# Patient Record
Sex: Male | Born: 1985 | Race: White | Hispanic: No | Marital: Married | Smoking: Former smoker
Health system: Southern US, Community
[De-identification: ages and names within clinical notes are randomized; demographics above are authoritative.]

## PROBLEM LIST (undated history)

## (undated) DIAGNOSIS — R55 Syncope and collapse: Secondary | ICD-10-CM

## (undated) DIAGNOSIS — G43909 Migraine, unspecified, not intractable, without status migrainosus: Secondary | ICD-10-CM

## (undated) HISTORY — DX: Syncope and collapse: R55

## (undated) HISTORY — PX: NO PAST SURGERIES: SHX2092

---

## 2008-02-14 ENCOUNTER — Emergency Department (HOSPITAL_COMMUNITY): Admission: EM | Admit: 2008-02-14 | Discharge: 2008-02-14 | Payer: Self-pay | Admitting: Emergency Medicine

## 2009-07-14 IMAGING — CR DG CHEST 2V
2 series · 2 of 2 positions shown · non-contrast
Comparison: None

CLINICAL DATA: Chest pain

CHEST - 2 VIEW

[w chest pa]
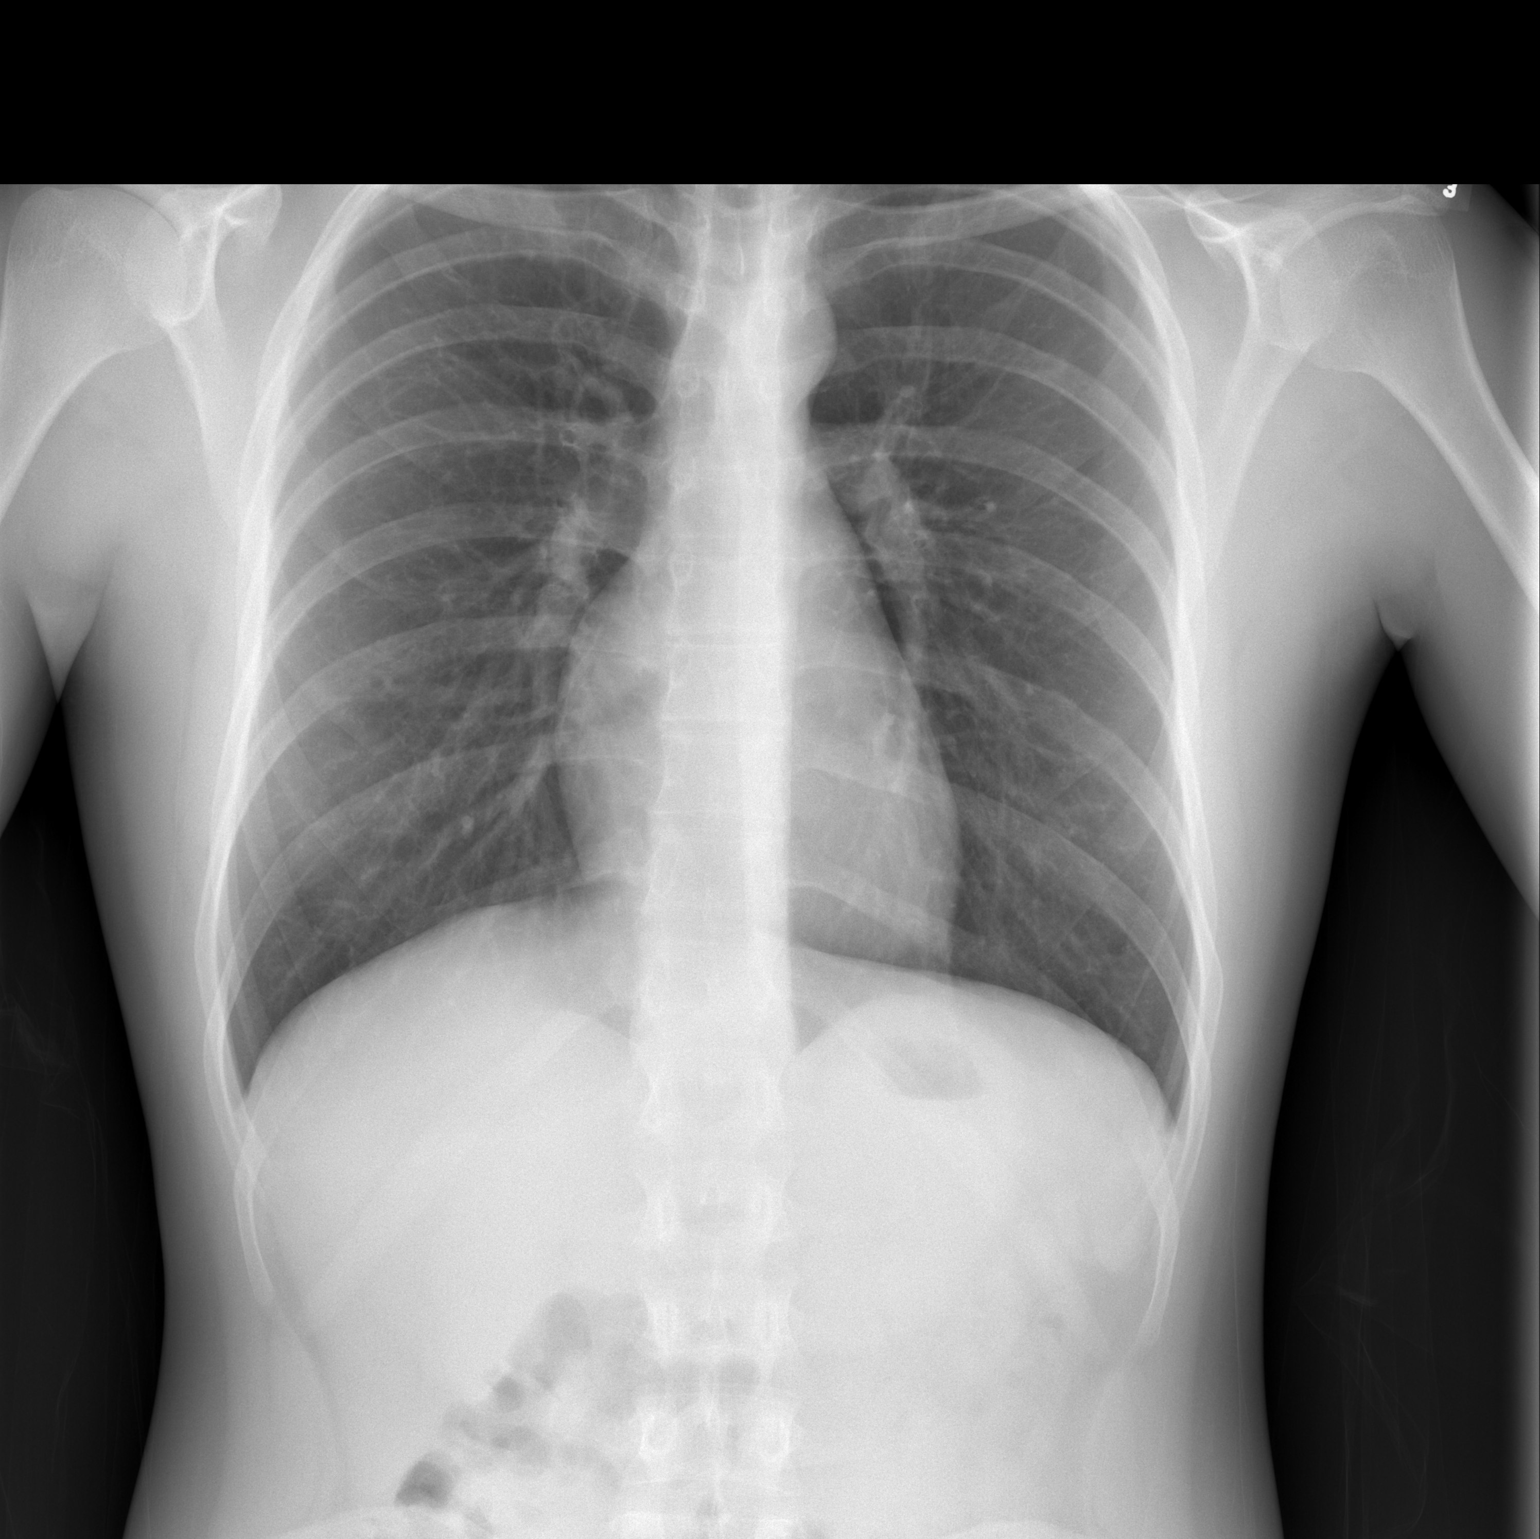

[w chest lat]
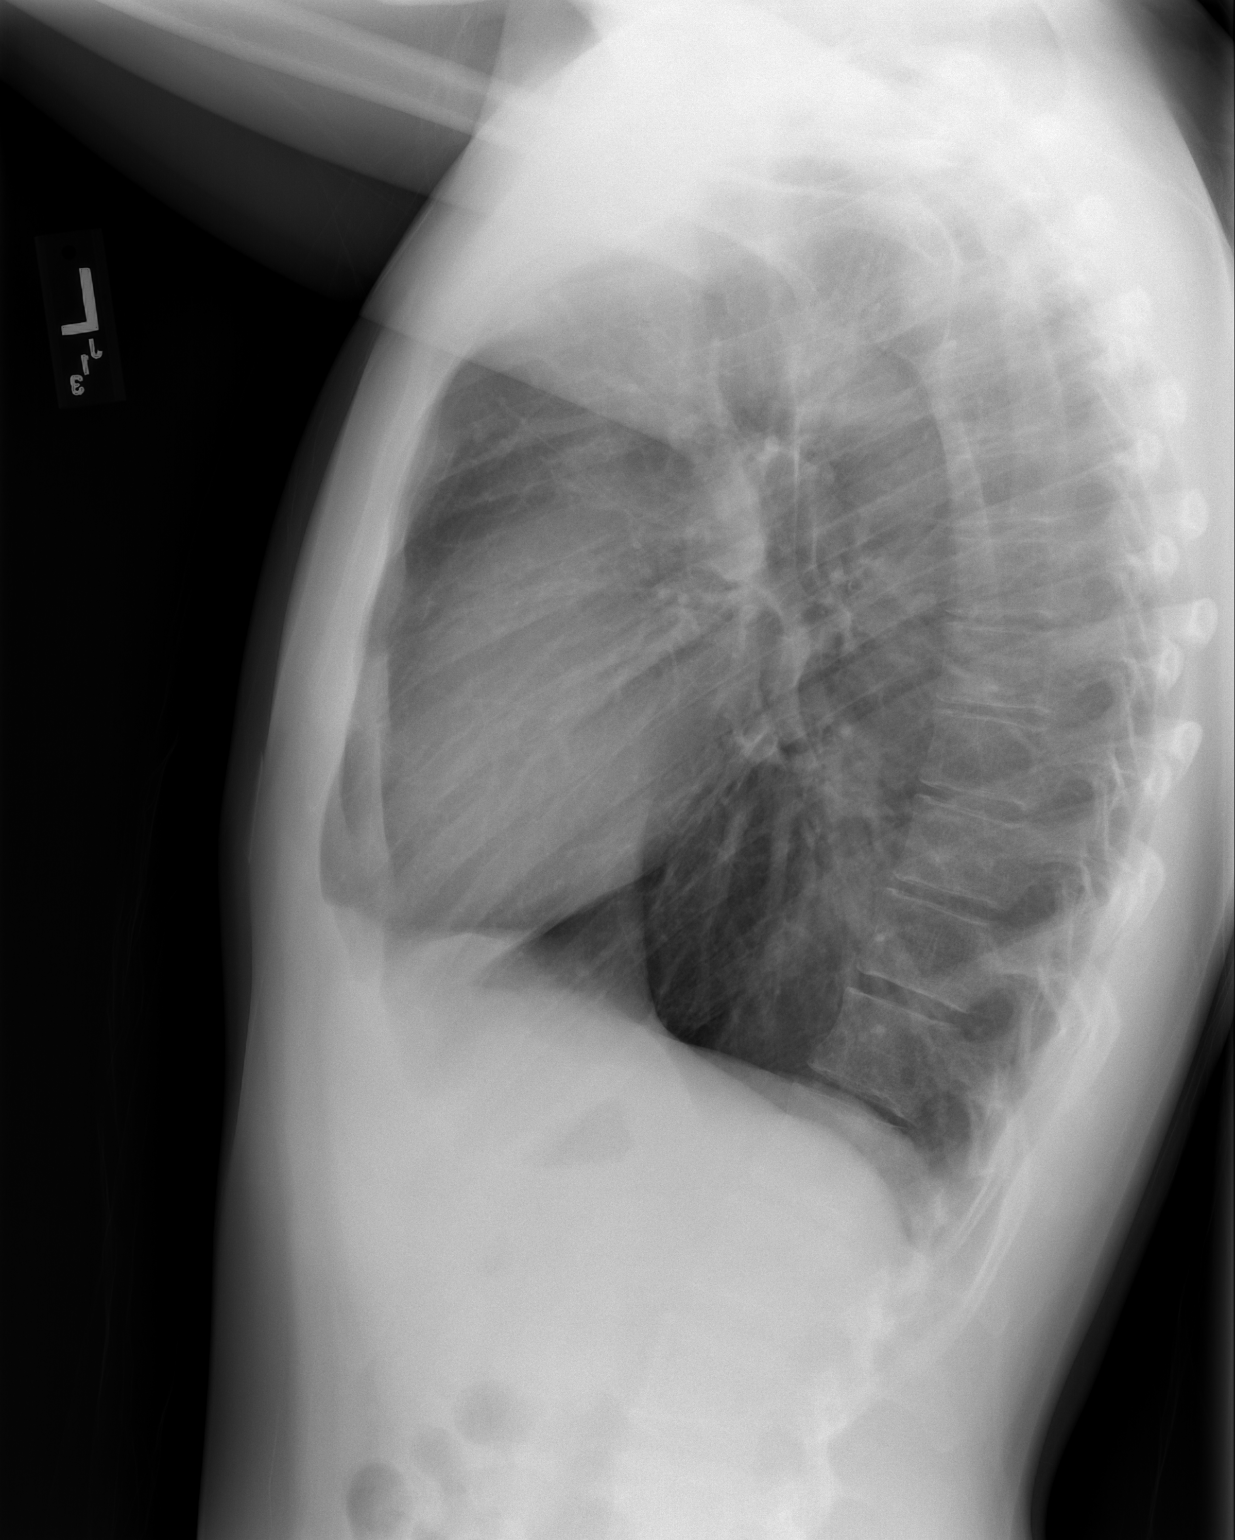

[2 of 2 positions shown; findings below may reference images not displayed]

FINDINGS: The heart size and mediastinal contours are within normal
limits.  Both lungs are clear.  The visualized skeletal structures
are unremarkable.
IMPRESSION: No active cardiopulmonary disease.

## 2011-04-25 LAB — POCT I-STAT, CHEM 8
BUN: 8
Calcium, Ion: 1.21
Chloride: 104
Creatinine, Ser: 1
Sodium: 142
TCO2: 28

## 2011-08-08 ENCOUNTER — Ambulatory Visit: Payer: Worker's Compensation

## 2011-08-12 ENCOUNTER — Ambulatory Visit: Payer: Worker's Compensation

## 2012-03-10 ENCOUNTER — Ambulatory Visit (INDEPENDENT_AMBULATORY_CARE_PROVIDER_SITE_OTHER): Payer: BC Managed Care – PPO | Admitting: Family Medicine

## 2012-03-10 VITALS — BP 118/64 | HR 70 | Temp 97.6°F | Resp 16 | Ht 65.5 in | Wt 138.4 lb

## 2012-03-10 DIAGNOSIS — R55 Syncope and collapse: Secondary | ICD-10-CM

## 2012-03-10 HISTORY — DX: Syncope and collapse: R55

## 2012-03-10 LAB — POCT CBC
Hemoglobin: 14.7 g/dL (ref 14.1–18.1)
Lymph, poc: 1.8 (ref 0.6–3.4)
MCH, POC: 27.8 pg (ref 27–31.2)
MCHC: 31.2 g/dL — AB (ref 31.8–35.4)
MID (cbc): 0.7 (ref 0–0.9)
MPV: 8.9 fL (ref 0–99.8)
POC Granulocyte: 9.4 — AB (ref 2–6.9)
POC MID %: 6.1 %M (ref 0–12)
Platelet Count, POC: 374 10*3/uL (ref 142–424)
RDW, POC: 13.2 %
WBC: 11.9 10*3/uL — AB (ref 4.6–10.2)

## 2012-03-10 NOTE — Patient Instructions (Signed)
Nice to meet you.  I think you will be ok I would decrease amount of caffeine slowly.  If you get any shortness of breath, more dizziness or chest pain please come back and see [place] again.  Neurocardiogenic Syncope, Child Neurocardiogenic syncope (NCS) is the most common cause of fainting in children. It is a response to a sudden and brief loss of consciousness due to decreased blood flow to the brain. It is uncommon before 4 to 24 years of age.  CAUSES  NCS is caused by a decrease in the blood pressure and heart rate due to a series of events in the nervous and cardiac systems. Many things and situations can trigger an episode. Some of these include:  Pain.   Fear.   The sight of blood.   Common activities like coughing, swallowing, stretching, and going to the bathroom.   Emotional stress.   Prolonged standing (especially in a warm environment).   Lack of sleep or rest.   Not eating for a longtime.   Not drinking enough liquids.   Recent illness.  SYMPTOMS  Before the fainting episode, your child may:  Feel dizzy or light-headed.   Sense that he or she is going to faint.   Feel like the room is spinning.   Feel sick to their stomach (nauseous).   See spots or slowly lose vision.   Hear ringing in the ears.   Have a headache.   Feel hot and sweaty.   Have no warnings at all.  DIAGNOSIS The diagnosis is made after a history is taken and by doing tests to rule out other causes for fainting. Testing may include the following:  Blood tests.   A test of the electrical function of the heart (electrocardiogram, ECG, EKG).   A test used check response to change in position (tilt table test).   A test to get a picture of the heart using sound waves (echocardiogram).  TREATMENT Treatment of NCS is usually limited to reassurance and home remedies. If home treatments do not work, your child's caregiver may prescribe medicines to help prevent fainting. Talk to your  caregiver if you have any questions about NCS or treatment. HOME CARE INSTRUCTIONS   Teach your child the warning signs of NCS.   Have your child sit or lie down at the first warning sign of a fainting spell. If sitting, have them put their head down between their legs.   Your child should avoid hot tubs, saunas, or prolonged standing.   Have your child drink enough fluids to keep their urine clear or pale yello and have them avoid caffeine. Let your child have a bottle of water in school.   Increase salt in your child's diet as instructed by your child's caregiver.   If your child has to stand for a long time, have them:   Cross their legs.   Flex and stretch their leg muscles.   Squat.   Move their legs.   Bend over.   Do notsuddenly stop any of their medicines prescribed for NCS.  Remember that even though these spells are scary to watch, they do not harm the child.  SEEK MEDICAL CARE IF:   Fainting spells continue in spite of the treatment or more frequently.   Loss of consciousness lasts more than a few seconds.   Fainting spells occur during or after excercising, or after being startled.   New symptoms occur with the fainting spells such as:   Shortness of  breath.   Chest pain.   Irregular heart beats.   Twitching or stiffening spells:   Happen without obvious fainting.   Last longer than a few seconds.   Take longer than a few seconds to recover from.  SEEK IMMEDIATE MEDICAL CARE IF:  Injuries or bleeding happens after a fainting spell.   Twitching and stiffening spells last more than 5 minutes.   One twitching and stiffening spell follows another without a return of consciousness.  Document Released: 04/22/2008 Document Revised: 07/03/2011 Document Reviewed: 04/22/2008 [facility] Patient Information 2012 [place], [place].

## 2012-03-10 NOTE — Progress Notes (Signed)
Patient ID: Justin Ellis, male   DOB: 1985-12-19, 26 y.o.   MRN: [id number]  S: 26 yo male with no significant PMHx who was driving to work today felt a little light headed and weak while he drove, was able to pull into work and able to get into the building but did fall when he went in.  Patient did not LOC, did not hit head.  Patient states his friends at work took him to the bathroom he did have stomach cramps during his drive as well and was able to defecate, diarrhea, no black tarry stool, no blood  but relieved the cramping.  Patient then started to feel better overall. EMS reponded check vitals and CBG which was norma.  Patient feels back to his baseline.   Patient states he has not had any significant life changes, he has decrease the amount of caffeine and did not eat any breakfast before he went to work. Patient also states he drinks about 2 L of sugary soda daily. Patient did not have any today.  Review of systems: Patient denies any headache any blurred vision any loss of vision, any history of head injury, any numbness or tingling in the extremities or any loss of bowel during the event.  Past medical surgical family history was all reviewed without any changes.  Physical exam: Filed Vitals:   03/10/12 1656  BP: 118/64  Pulse: 70  Temp: 97.6 F (36.4 C)  Resp: 16   HEENT: Pupils equal round reactive light and accommodation, extraocular movements intact, cranial nerve II through XII are intact, thyroid nonpalpable nontender. Cardiovascular: Regular rate and rhythm no murmur appreciated carotids no bruit Respiratory: Clear to auscultation bilaterally  abdomen: Bowel sounds positive nontender nondistended liver and spleen normal size negative McBurney's. Neuro: Deep tendon reflex 2+ and symmetric bilaterally, neurovascularly intact in all extremities. Bilateral 5 strength in all extremities. Extremities: No edema 2+ distal pulses palpated.  EKG was reviewed today patient has a  normal sinus rhythm with no signs of reentry pathways or any signs of PVCs.

## 2012-03-10 NOTE — Assessment & Plan Note (Addendum)
Pt is healthy 26 yo male, EKG normal today, will get labs rule out electrolyte or anemia as possible causes.  Differential include, dehydration, vasovagal, panic attack or very low liklihood and cardiac.  F/u labs, decrease caffeine intake, and f/u with any symptoms listed in AVS.  If occurs again would then send to cardiologist for further workup and CXR but hopefully not necessary.

## 2012-03-11 LAB — BASIC METABOLIC PANEL
Calcium: 10 mg/dL (ref 8.4–10.5)
Creat: 0.8 mg/dL (ref 0.50–1.35)
Sodium: 139 mEq/L (ref 135–145)

## 2012-03-15 ENCOUNTER — Encounter: Payer: Self-pay | Admitting: Radiology

## 2015-02-05 ENCOUNTER — Encounter (HOSPITAL_COMMUNITY): Payer: Self-pay

## 2015-02-05 ENCOUNTER — Emergency Department (HOSPITAL_COMMUNITY)
Admission: EM | Admit: 2015-02-05 | Discharge: 2015-02-06 | Disposition: A | Discharge status: Home / Self Care | Payer: BLUE CROSS/BLUE SHIELD | Attending: Emergency Medicine | Admitting: Emergency Medicine

## 2015-02-05 DIAGNOSIS — Z720 Tobacco use: Secondary | ICD-10-CM | POA: Insufficient documentation

## 2015-02-05 DIAGNOSIS — Z79899 Other long term (current) drug therapy: Secondary | ICD-10-CM | POA: Insufficient documentation

## 2015-02-05 DIAGNOSIS — G8929 Other chronic pain: Secondary | ICD-10-CM | POA: Diagnosis not present

## 2015-02-05 DIAGNOSIS — R42 Dizziness and giddiness: Secondary | ICD-10-CM

## 2015-02-05 NOTE — ED Notes (Signed)
Pt complains of having dizzy spells and near sycopal episodes for several weeks

## 2015-02-06 LAB — COMPREHENSIVE METABOLIC PANEL
ALBUMIN: 4.3 g/dL (ref 3.5–5.0)
ALT: 18 U/L (ref 17–63)
AST: 20 U/L (ref 15–41)
Alkaline Phosphatase: 70 U/L (ref 38–126)
Anion gap: 7 (ref 5–15)
BILIRUBIN TOTAL: 0.6 mg/dL (ref 0.3–1.2)
BUN: 6 mg/dL (ref 6–20)
CALCIUM: 9.3 mg/dL (ref 8.9–10.3)
CHLORIDE: 106 mmol/L (ref 101–111)
CO2: 29 mmol/L (ref 22–32)
CREATININE: 0.85 mg/dL (ref 0.61–1.24)
GLUCOSE: 114 mg/dL — AB (ref 65–99)
POTASSIUM: 3.7 mmol/L (ref 3.5–5.1)
SODIUM: 142 mmol/L (ref 135–145)
TOTAL PROTEIN: 7.4 g/dL (ref 6.5–8.1)

## 2015-02-06 LAB — CBC WITH DIFFERENTIAL/PLATELET
BASOS ABS: 0 10*3/uL (ref 0.0–0.1)
BASOS PCT: 1 % (ref 0–1)
EOS ABS: 0.1 10*3/uL (ref 0.0–0.7)
Eosinophils Relative: 2 % (ref 0–5)
HCT: 42.1 % (ref 39.0–52.0)
HEMOGLOBIN: 13.7 g/dL (ref 13.0–17.0)
LYMPHS ABS: 2.1 10*3/uL (ref 0.7–4.0)
Lymphocytes Relative: 36 % (ref 12–46)
MCH: 28.3 pg (ref 26.0–34.0)
MCHC: 32.5 g/dL (ref 30.0–36.0)
MCV: 87 fL (ref 78.0–100.0)
MONO ABS: 0.7 10*3/uL (ref 0.1–1.0)
Monocytes Relative: 12 % (ref 3–12)
NEUTROS PCT: 49 % (ref 43–77)
Neutro Abs: 2.9 10*3/uL (ref 1.7–7.7)
PLATELETS: 268 10*3/uL (ref 150–400)
RBC: 4.84 MIL/uL (ref 4.22–5.81)
RDW: 12.5 % (ref 11.5–15.5)
WBC: 5.8 10*3/uL (ref 4.0–10.5)

## 2015-02-06 NOTE — ED Provider Notes (Signed)
CSN: [id number]     Arrival date & time 02/05/15  2047 History   First MD Initiated Contact with Patient 02/06/15 0030     Chief Complaint  Patient presents with  . Dizziness     (Consider location/radiation/quality/duration/timing/severity/associated sxs/prior Treatment) HPI  This is a 29 year old male with a history of intermittent lightheadedness for several months. He is here this morning because his symptoms have acutely worsened. He had an episode where his vision became black and he felt like he was going to fall to the ground. His symptoms are worse on standing. He denies associated palpitations or chest pain. He is having no focal neurologic deficits. He has chronic joint pain, particularly in his knees, but this is not new. He does not wish any pain medication as he has 2 brothers with narcotic addiction problems.  Past Medical History  Diagnosis Date  . Vasovagal near syncope 03/10/2012   History reviewed. No pertinent past surgical history. History reviewed. No pertinent family history. History  Substance Use Topics  . Smoking status: Current Every Day Smoker -- 1.00 packs/day for 3 years    Types: Cigarettes  . Smokeless tobacco: Not on file  . Alcohol Use: Yes    Review of Systems  All other systems reviewed and are negative.   Allergies  Review of patient's allergies indicates no known allergies.  Home Medications   Prior to Admission medications   Medication Sig Start Date End Date Taking? Authorizing Provider  Aspirin-Salicylamide-Caffeine (BC HEADACHE POWDER PO) Take 1 packet by mouth every 6 (six) hours as needed (for headache/pain.).   Yes Historical Provider, MD  pseudoephedrine (SUDAFED 12 HOUR) 120 MG 12 hr tablet Take 120 mg by mouth daily.   Yes Historical Provider, MD   BP 119/80 mmHg  Pulse 80  Temp(Src) 98.3 F (36.8 C) (Oral)  Resp 18  Ht 5\' 6"  (1.676 m)  Wt 119 lb (53.978 kg)  BMI 19.22 kg/m2  SpO2 100%   Physical Exam  General:  Well-developed, well-nourished male in no acute distress; appearance consistent with age of record HENT: normocephalic; atraumatic Eyes: pupils equal, round and reactive to light; extraocular muscles intact Neck: supple Heart: regular rate and rhythm; no murmur Lungs: clear to auscultation bilaterally Abdomen: soft; nondistended; nontender; no masses or hepatosplenomegaly; bowel sounds present Extremities: No deformity; full range of motion; pulses normal Neurologic: Awake, alert and oriented; motor function intact in all extremities and symmetric; no facial droop Skin: Warm and dry Psychiatric: Normal mood and affect    ED Course  Procedures (including critical care time)   MDM  Nursing notes and vitals signs, including pulse oximetry, reviewed.  Summary of this visit's results, reviewed by myself:   EKG Interpretation  Date/Time:  Tuesday February 06 2015 01:03:15 EDT Ventricular Rate:  73 PR Interval:  126 QRS Duration: 88 QT Interval:  374 QTC Calculation: 412 R Axis:   86 Text Interpretation:  Sinus rhythm Normal ECG No previous ECGs available Confirmed by Lilianah Buffin  MD, Jonny RuizJOHN ([id number]) on 02/06/2015 1:07:30 AM      Labs:  Results for orders placed or performed during the hospital encounter of 02/05/15 (from the past 24 hour(s))  CBC with Differential/Platelet     Status: None   Collection Time: 02/06/15 12:50 AM  Result Value Ref Range   WBC 5.8 4.0 - 10.5 K/uL   RBC 4.84 4.22 - 5.81 MIL/uL   Hemoglobin 13.7 13.0 - 17.0 g/dL   HCT 19.142.1 47.839.0 - 29.552.0 %  MCV 87.0 78.0 - 100.0 fL   MCH 28.3 26.0 - 34.0 pg   MCHC 32.5 30.0 - 36.0 g/dL   RDW 40.9 81.1 - 91.4 %   Platelets 268 150 - 400 K/uL   Neutrophils Relative % 49 43 - 77 %   Neutro Abs 2.9 1.7 - 7.7 K/uL   Lymphocytes Relative 36 12 - 46 %   Lymphs Abs 2.1 0.7 - 4.0 K/uL   Monocytes Relative 12 3 - 12 %   Monocytes Absolute 0.7 0.1 - 1.0 K/uL   Eosinophils Relative 2 0 - 5 %   Eosinophils Absolute 0.1 0.0 - 0.7 K/uL    Basophils Relative 1 0 - 1 %   Basophils Absolute 0.0 0.0 - 0.1 K/uL  Comprehensive metabolic panel     Status: Abnormal   Collection Time: 02/06/15 12:50 AM  Result Value Ref Range   Sodium 142 135 - 145 mmol/L   Potassium 3.7 3.5 - 5.1 mmol/L   Chloride 106 101 - 111 mmol/L   CO2 29 22 - 32 mmol/L   Glucose, Bld 114 (H) 65 - 99 mg/dL   BUN 6 6 - 20 mg/dL   Creatinine, Ser 7.82 0.61 - 1.24 mg/dL   Calcium 9.3 8.9 - 95.6 mg/dL   Total Protein 7.4 6.5 - 8.1 g/dL   Albumin 4.3 3.5 - 5.0 g/dL   AST 20 15 - 41 U/L   ALT 18 17 - 63 U/L   Alkaline Phosphatase 70 38 - 126 U/L   Total Bilirubin 0.6 0.3 - 1.2 mg/dL   GFR calc non Af Amer >60 >60 mL/min   GFR calc Af Amer >60 >60 mL/min   Anion gap 7 5 - 15   2:09 AM Patient advised of unremarkable workup. He was advised that further investigation may be warranted, such as a cardiology referral or tilt table test. He has a primary care physician with whom he can follow-up.    Paula Libra, MD 02/06/15 [phone]

## 2015-02-09 ENCOUNTER — Other Ambulatory Visit: Payer: Self-pay | Admitting: Family Medicine

## 2015-02-09 ENCOUNTER — Encounter: Payer: Self-pay | Admitting: Family Medicine

## 2015-02-09 ENCOUNTER — Ambulatory Visit (INDEPENDENT_AMBULATORY_CARE_PROVIDER_SITE_OTHER): Payer: BLUE CROSS/BLUE SHIELD

## 2015-02-09 ENCOUNTER — Ambulatory Visit (INDEPENDENT_AMBULATORY_CARE_PROVIDER_SITE_OTHER): Payer: BLUE CROSS/BLUE SHIELD | Admitting: Family Medicine

## 2015-02-09 VITALS — BP 97/68 | HR 91 | Temp 98.4°F | Resp 16 | Ht 66.0 in | Wt 120.0 lb

## 2015-02-09 DIAGNOSIS — M25561 Pain in right knee: Secondary | ICD-10-CM

## 2015-02-09 DIAGNOSIS — R42 Dizziness and giddiness: Secondary | ICD-10-CM | POA: Diagnosis not present

## 2015-02-09 DIAGNOSIS — I951 Orthostatic hypotension: Secondary | ICD-10-CM

## 2015-02-09 DIAGNOSIS — J3489 Other specified disorders of nose and nasal sinuses: Secondary | ICD-10-CM

## 2015-02-09 DIAGNOSIS — G90A Postural orthostatic tachycardia syndrome (POTS): Secondary | ICD-10-CM

## 2015-02-09 DIAGNOSIS — R51 Headache: Secondary | ICD-10-CM

## 2015-02-09 DIAGNOSIS — R519 Headache, unspecified: Secondary | ICD-10-CM

## 2015-02-09 DIAGNOSIS — Q682 Congenital deformity of knee: Secondary | ICD-10-CM | POA: Diagnosis not present

## 2015-02-09 DIAGNOSIS — M25562 Pain in left knee: Secondary | ICD-10-CM

## 2015-02-09 DIAGNOSIS — R000 Tachycardia, unspecified: Secondary | ICD-10-CM | POA: Diagnosis not present

## 2015-02-09 LAB — POCT URINALYSIS DIPSTICK
BILIRUBIN UA: NEGATIVE
Blood, UA: NEGATIVE
Glucose, UA: NEGATIVE
KETONES UA: NEGATIVE
LEUKOCYTES UA: NEGATIVE
NITRITE UA: NEGATIVE
PH UA: 6
Protein, UA: NEGATIVE
UROBILINOGEN UA: 0.2

## 2015-02-09 LAB — VITAMIN B12: Vitamin B-12: 383 pg/mL (ref 211–911)

## 2015-02-09 LAB — TSH: TSH: 1.947 u[IU]/mL (ref 0.350–4.500)

## 2015-02-09 NOTE — Patient Instructions (Addendum)
Knee Pain The knee is the complex joint between your thigh and your lower leg. It is made up of bones, tendons, ligaments, and cartilage. The bones that make up the knee are:  The femur in the thigh.  The tibia and fibula in the lower leg.  The patella or kneecap riding in the groove on the lower femur. CAUSES  Knee pain is a common complaint with many causes. A few of these causes are:  Injury, such as:  A ruptured ligament or tendon injury.  Torn cartilage.  Medical conditions, such as:  Gout  Arthritis  Infections  Overuse, over training, or overdoing a physical activity. Knee pain can be minor or severe. Knee pain can accompany debilitating injury. Minor knee problems often respond well to self-care measures or get well on their own. More serious injuries may need medical intervention or even surgery. SYMPTOMS The knee is complex. Symptoms of knee problems can vary widely. Some of the problems are:  Pain with movement and weight bearing.  Swelling and tenderness.  Buckling of the knee.  Inability to straighten or extend your knee.  Your knee locks and you cannot straighten it.  Warmth and redness with pain and fever.  Deformity or dislocation of the kneecap. DIAGNOSIS  Determining what is wrong may be very straight forward such as when there is an injury. It can also be challenging because of the complexity of the knee. Tests to make a diagnosis may include:  Your caregiver taking a history and doing a physical exam.  Routine X-rays can be used to rule out other problems. X-rays will not reveal a cartilage tear. Some injuries of the knee can be diagnosed by:  Arthroscopy a surgical technique by which a small video camera is inserted through tiny incisions on the sides of the knee. This procedure is used to examine and repair internal knee joint problems. Tiny instruments can be used during arthroscopy to repair the torn knee cartilage  (meniscus).  Arthrography is a radiology technique. A contrast liquid is directly injected into the knee joint. Internal structures of the knee joint then become visible on X-ray film.  An MRI scan is a non X-ray radiology procedure in which magnetic fields and a computer produce two- or three-dimensional images of the inside of the knee. Cartilage tears are often visible using an MRI scanner. MRI scans have largely replaced arthrography in diagnosing cartilage tears of the knee.  Blood work.  Examination of the fluid that helps to lubricate the knee joint (synovial fluid). This is done by taking a sample out using a needle and a syringe. TREATMENT The treatment of knee problems depends on the cause. Some of these treatments are:  Depending on the injury, proper casting, splinting, surgery, or physical therapy care will be needed.  Give yourself adequate recovery time. Do not overuse your joints. If you begin to get sore during workout routines, back off. Slow down or do fewer repetitions.  For repetitive activities such as cycling or running, maintain your strength and nutrition.  Alternate muscle groups. For example, if you are a weight lifter, work the upper body on one day and the lower body the next.  Either tight or weak muscles do not give the proper support for your knee. Tight or weak muscles do not absorb the stress placed on the knee joint. Keep the muscles surrounding the knee strong.  Take care of mechanical problems.  If you have flat feet, orthotics or special shoes may help.  See your caregiver if you need help.  Arch supports, sometimes with wedges on the inner or outer aspect of the heel, can help. These can shift pressure away from the side of the knee most bothered by osteoarthritis.  A brace called an "unloader" brace also may be used to help ease the pressure on the most arthritic side of the knee.  If your caregiver has prescribed crutches, braces, wraps or ice,  use as directed. The acronym for this is PRICE. This means protection, rest, ice, compression, and elevation.  Nonsteroidal anti-inflammatory drugs (NSAIDs), can help relieve pain. But if taken immediately after an injury, they may actually increase swelling. Take NSAIDs with food in your stomach. Stop them if you develop stomach problems. Do not take these if you have a history of ulcers, stomach pain, or bleeding from the bowel. Do not take without your caregiver's approval if you have problems with fluid retention, heart failure, or kidney problems.  For ongoing knee problems, physical therapy may be helpful.  Glucosamine and chondroitin are over-the-counter dietary supplements. Both may help relieve the pain of osteoarthritis in the knee. These medicines are different from the usual anti-inflammatory drugs. Glucosamine may decrease the rate of cartilage destruction.  Injections of a corticosteroid drug into your knee joint may help reduce the symptoms of an arthritis flare-up. They may provide pain relief that lasts a few months. You may have to wait a few months between injections. The injections do have a small increased risk of infection, water retention, and elevated blood sugar levels.  Hyaluronic acid injected into damaged joints may ease pain and provide lubrication. These injections may work by reducing inflammation. A series of shots may give relief for as long as 6 months.  Topical painkillers. Applying certain ointments to your skin may help relieve the pain and stiffness of osteoarthritis. Ask your pharmacist for suggestions. Many over the-counter products are approved for temporary relief of arthritis pain.  In some countries, doctors often prescribe topical NSAIDs for relief of chronic conditions such as arthritis and tendinitis. A review of treatment with NSAID creams found that they worked as well as oral medications but without the serious side effects. PREVENTION  Maintain a  healthy weight. Extra pounds put more strain on your joints.  Get strong, stay limber. Weak muscles are a common cause of knee injuries. Stretching is important. Include flexibility exercises in your workouts.  Be smart about exercise. If you have osteoarthritis, chronic knee pain or recurring injuries, you may need to change the way you exercise. This does not mean you have to stop being active. If your knees ache after jogging or playing basketball, consider switching to swimming, water aerobics, or other low-impact activities, at least for a few days a week. Sometimes limiting high-impact activities will provide relief.  Make sure your shoes fit well. Choose footwear that is right for your sport.  Protect your knees. Use the proper gear for knee-sensitive activities. Use kneepads when playing volleyball or laying carpet. Buckle your seat belt every time you drive. Most shattered kneecaps occur in car accidents.  Rest when you are tired. SEEK MEDICAL CARE IF:  You have knee pain that is continual and does not seem to be getting better.  SEEK IMMEDIATE MEDICAL CARE IF:  Your knee joint feels hot to the touch and you have a high fever. MAKE SURE YOU:   Understand these instructions.  Will watch your condition.  Will get help right away if you are not  doing well or get worse. Document Released: 05/11/2007 Document Revised: 10/06/2011 Document Reviewed: 05/11/2007 [facility] Patient Information 2015 [place], [place]. This information is not intended to replace advice given to you by your health care provider. Make sure you discuss any questions you have with your health care provider. Postural Orthostatic Tachycardia Syndrome Postural orthostatic tachycardia syndrome (POTS) is an increased heart rate when going from a lying (supine) position to a standing position. The heart rate may increase more than 30 beats per minute (BPM) above its resting rate when going from a lying to a standing  position. POTS occurs more frequently in women than in men.  SYMPTOMS  POTS symptoms may be increased in the morning. Symptoms of POTS include:  Fainting or near fainting.  Inability to think clearly.  Extreme or chronic fatigue.  Exercise intolerance.  Chest pain.  Having the lower legs develop a reddish-blue color due to decreased blood flow (acrocyanosis). CAUSES POTS can be caused by different conditions. Sometimes, it has no known cause (idiopathic). Some causes of POTS include:  Viral illness.  Pregnancy.  Autoimmune diseases.  Medications.  Major surgery.  Trauma such as a car accident or major injury.  Medical conditions such as anemia, dehydration, and hyperthyroidism. DIAGNOSIS  POTS is diagnosed by:  Taking a complete history and physical exam.  Measuring the heart rate while lying and then upon standing.  Measuring blood pressure when going from a lying to a standing position. POTS is usually not associated with low blood pressure (orthostatic hypotension) when going from a lying to standing position. While standing, blood pressure should be taken 2, 5, and 10 minutes after getting up. TREATMENT  Treatment of POTS depends upon the severity of the symptoms. Treatment includes:  Drinking plenty of fluids to avoid getting dehydrated.  Avoiding very hot environments to not get overheated.  Increasing your dietary salt intake as instructed by your caregiver.  Taking different types of medications as prescribed for POTS.  Avoiding some classes of medications such as vasodilators and diuretics. SEEK IMMEDIATE MEDICAL CARE IF  You have severe chest pain that does not go away. Call your local emergency service immediately.  You feel your heart racing or beating rapidly.  You feel like passing out.  You have very confused thinking. MAKE SURE YOU  Understand these instructions.  Will watch your condition.  Will get help right away if you are not  doing well or get worse. Document Released: 07/04/2002 Document Revised: 11/28/2013 Document Reviewed: 09/11/2010 [facility] Patient Information 2015 [place], [place]. This information is not intended to replace advice given to you by your health care provider. Make sure you discuss any questions you have with your health care provider. Orthostatic Hypotension Orthostatic hypotension is a sudden drop in blood pressure. It happens when you quickly stand up from a seated or lying position. You may feel dizzy or light-headed. This can last for just a few seconds or for up to a few minutes. It is usually not a serious problem. However, if this happens frequently or gets worse, it can be a sign of something more serious. CAUSES  Different things can cause orthostatic hypotension, including:   Loss of body fluids (dehydration).  Medicines that lower blood pressure.  Sudden changes in posture, such as standing up quickly after you have been sitting or lying down.  Taking too much of your medicine. SIGNS AND SYMPTOMS   Light-headedness or dizziness.   Fainting or near-fainting.   A fast heart rate.  Weakness.   Feeling tired (fatigue).  DIAGNOSIS  Your health care provider may do several things to help diagnose your condition and identify the cause. These may include:   Taking a medical history and doing a physical exam.  Checking your blood pressure. Your health care provider will check your blood pressure when you are:  Lying down.  Sitting.  Standing.  Using tilt table testing. In this test, you lie down on a table that moves from a lying position to a standing position. You will be strapped onto the table. This test monitors your blood pressure and heart rate when you are in different positions. TREATMENT  Treatment will vary depending on the cause. Possible treatments include:   Changing the dosage of your medicines.  Wearing compression stockings on your lower  legs.  Standing up slowly after sitting or lying down.  Eating more salt.  Eating frequent, small meals.  In some cases, getting IV fluids.  Taking medicine to enhance fluid retention. HOME CARE INSTRUCTIONS  Only take over-the-counter or prescription medicines as directed by your health care provider.  Follow your health care provider's instructions for changing the dosage of your current medicines.  Do not stop or adjust your medicine on your own.  Stand up slowly after sitting or lying down. This allows your body to adjust to the different position.  Wear compression stockings as directed.  Eat extra salt as directed.  Do not add extra salt to your diet unless directed to by your health care provider.  Eat frequent, small meals.  Avoid standing suddenly after eating.  Avoid hot showers or excessive heat as directed by your health care provider.  Keep all follow-up appointments. SEEK MEDICAL CARE IF:  You continue to feel dizzy or light-headed after standing.  You feel groggy or confused.  You feel cold, clammy, or sick to your stomach (nauseous).  You have blurred vision.  You feel short of breath. SEEK IMMEDIATE MEDICAL CARE IF:   You faint after standing.  You have chest pain.  You have difficulty breathing.   You lose feeling or movement in your arms or legs.   You have slurred speech or difficulty talking, or you are unable to talk.  MAKE SURE YOU:   Understand these instructions.  Will watch your condition.  Will get help right away if you are not doing well or get worse. Document Released: 07/04/2002 Document Revised: 07/19/2013 Document Reviewed: 05/06/2013 [facility] Patient Information 2015 [place], [place]. This information is not intended to replace advice given to you by your health care provider. Make sure you discuss any questions you have with your health care provider.

## 2015-02-09 NOTE — Progress Notes (Signed)
Subjective:  This chart was scribed for Norberto Sorenson, MD by Andrew Au, ED Scribe. This patient was seen in room 25 and the patient's care was started at 2:57 PM   Patient ID: Justin Ellis, male    DOB: [birth year], 29 y.o.   MRN: [id number]  HPI   Chief Complaint  Patient presents with  . Knee Pain    1 year  . feeling of inflammation in neck  . Dizziness    6 mths   HPI Comments: Justin Ellis is a 29 y.o. male who presents to the [facility] for ED follow up. Pt is new to our office was in the ED 4 days ago for intermittent light headedness. He had a presyncopal episode prior to that visit. He had hx of vasovagal syncope. Strong family hx of drug addiction. Pt reassured there were no abnormalities. EKG was normal, advised to see pcp for work up and referral to cardiology. Normal CBC and CNP.  Light headedness Pt has a physical job and often experiences light headedness and dizziness at the end of his work day. Pt works in Set [employer] which requires heavy lifting, movement and being on his feet all day. His light headedness can be triggered by food and has change his diet by cutting back on sugar, now drinking diet soda and increased his meal intake to 3 meals a day. He has sensation of light headedness that last a couple minutes with changing position. He drinks a lot of caffeine but does drink water to balance out. He stopped smoking cigarettes 6 months ago. He last had a syncopal episode about 3 years ago. He denies episodes with coughing sneezing and with BM. He is non compliant with multi vitamins. He denies nausea, emesis, palpitation and CP.   Headaches  Pt has had daily Headaches for several years since he was 29 y.o. He locates headaches to frontal and maxillary sinus. He takes sudafed 12 hour extended release every day with relief to HA. He has associated photophobia, nausea and sinus pressure that varies to different sinuses. He is able to breath through his  nose but is can feel the congestion in his sinuses. He has taken goody's and ibuprofen in the past but limits doses to about once a week due to medication causing. He denies cough. He has family hx of migraines including his mother    Knee pain He c/o bilateral knee pain. He reports knee pain with walking up steps and being on his feet all day. He reports some numbness from the knee down after getting out of the car after work as if his legs are not getting blood flow. He denies prior injury. He denies leg swelling.   Past Medical History  Diagnosis Date  . Vasovagal near syncope 03/10/2012   No past surgical history on file. Prior to Admission medications   Medication Sig Start Date End Date Taking? Authorizing Provider  Aspirin-Salicylamide-Caffeine (BC HEADACHE POWDER PO) Take 1 packet by mouth every 6 (six) hours as needed (for headache/pain.).   Yes Historical Provider, MD  pseudoephedrine (SUDAFED 12 HOUR) 120 MG 12 hr tablet Take 120 mg by mouth daily.   Yes Historical Provider, MD   Not on File  No family history on file. History   Social History  . Marital Status: Single    Spouse Name: N/A  . Number of Children: N/A  . Years of Education: N/A   Social History Main Topics  . Smoking status:  Former Smoker -- 1.00 packs/day for 3 years    Types: Cigarettes  . Smokeless tobacco: Not on file  . Alcohol Use: 0.0 oz/week    0 Standard drinks or equivalent per week  . Drug Use: Not on file  . Sexual Activity: Not on file   Other Topics Concern  . None   Social History Narrative    Review of Systems  HENT: Positive for congestion and sore throat.   Eyes: Positive for photophobia. Negative for visual disturbance.  Respiratory: Negative for cough and shortness of breath.   Cardiovascular: Negative for chest pain, palpitations and leg swelling.  Gastrointestinal: Positive for nausea. Negative for vomiting.  Musculoskeletal: Positive for arthralgias.  Neurological:  Positive for dizziness, light-headedness, numbness and headaches.   Objective:   Physical Exam  Constitutional: He is oriented to person, place, and time. He appears well-developed and well-nourished. No distress.  HENT:  Head: Atraumatic.  Right Ear: Hearing, tympanic membrane, external ear and ear canal normal.  Left Ear: Hearing, tympanic membrane and external ear normal.  Mouth/Throat: Oropharynx is clear and moist.  Polyp left nare near back septum question of nasal polyp  Eyes: Conjunctivae and EOM are normal.  Neck: Neck supple. No thyromegaly present.  Cardiovascular: Normal rate, regular rhythm, S1 normal, S2 normal and normal heart sounds.   No murmur heard. Pulmonary/Chest: Effort normal and breath sounds normal. No respiratory distress. He has no wheezes. He has no rales. He exhibits no tenderness.  Musculoskeletal: Normal range of motion.       Right knee: He exhibits normal range of motion and normal patellar mobility. No medial joint line and no lateral joint line tenderness noted.       Left knee: He exhibits normal range of motion and normal patellar mobility. No medial joint line and no lateral joint line tenderness noted.  No joint line tenderness. Full ROM.  Minimal crepitus. No increase patellar mobilty.   Lymphadenopathy:       Head (right side): No submandibular adenopathy present.       Head (left side): No submandibular adenopathy present.    He has no cervical adenopathy.  Neurological: He is alert and oriented to person, place, and time.  Skin: Skin is warm and dry.  Psychiatric: He has a normal mood and affect. His behavior is normal.  Nursing note and vitals reviewed.  Filed Vitals:   02/09/15 1408  BP: 115/80  Pulse: 73  Temp: 98.4 F (36.9 C)  TempSrc: Oral  Resp: 16  Weight: 120 lb (54.432 kg)   [facility] reading (PRIMARY) by Dr. Clelia Croft. Knee Xray. Elevated patella bilaterally. Question patella alta  Dg Knee 1-2 Views Left  02/09/2015   CLINICAL  DATA:  29 year old male with bilateral knee pain  EXAM: LEFT KNEE - 1-2 VIEW  COMPARISON:  None.  FINDINGS: There is no evidence of fracture, dislocation, or joint effusion. There is no evidence of arthropathy or other focal bone abnormality. The patella appears to be within upper limits of normal indication. All Soft tissues are unremarkable.  IMPRESSION: No acute fracture dislocation.   Electronically Signed   By: Elgie Collard M.D.   On: 02/09/2015 17:21   Dg Knee 1-2 Views Right  02/09/2015   CLINICAL DATA:  Right knee pain.  EXAM: RIGHT KNEE - 1-2 VIEW  COMPARISON:  None.  FINDINGS: The Insall-Salvati ratio is 1.14, at the upper limits of normal, indicating at the patient does not have patella alta.  The osseous structures  of the right knee are normal. No joint effusion.  IMPRESSION: Normal exam.   Electronically Signed   By: Francene BoyersJames  Maxwell M.D.   On: 02/09/2015 16:27   Positive orthostatics.   Assessment & Plan:    1. Knee pain, bilateral   2. Positional lightheadedness   3. Chronic daily headache - refer to neurology  4. 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 Start prn nsaids, ice, cont exercises - handout given  6. Orthostatic hypotension   7. POTS (postural orthostatic tachycardia syndrome) - possible diagnosis ? Refer to cardiology for eval of persistent symptomatic orthostatic hypotension.    Orders Placed This Encounter  Procedures  . DG Knee 1-2 Views Left    Standing Status: Future     Number of Occurrences: 1     Standing Expiration Date: 02/09/2016    Order Specific Question:  Reason for Exam (SYMPTOM  OR DIAGNOSIS REQUIRED)    Answer:  chronic pain    Order Specific Question:  Preferred imaging location?    Answer:  External  . DG Knee 1-2 Views Right    Standing Status: Future     Number of Occurrences: 1     Standing Expiration Date: 02/09/2016    Order Specific Question:  Reason for Exam (SYMPTOM  OR DIAGNOSIS  REQUIRED)    Answer:  chronic pain    Order Specific Question:  Preferred imaging location?    Answer:  External  . TSH  . Vitamin B12  . Vit D  25 hydroxy (rtn osteoporosis monitoring)  . Sedimentation Rate  . Ambulatory referral to Neurology    Referral Priority:  Routine    Referral Type:  Consultation    Referral Reason:  Specialty Services Required    Requested Specialty:  Neurology    Number of Visits Requested:  1  . Ambulatory referral to Cardiology    Referral Priority:  Routine    Referral Type:  Consultation    Referral Reason:  Specialty Services Required    Requested Specialty:  Cardiology    Number of Visits Requested:  1  . POCT urinalysis dipstick  . Pulmonary Function Test    Order Specific Question:  Where should this test be performed?    Answer:  Other    Order Specific Question:  Basic spirometry    Answer:  Yes      I personally performed the services described in this documentation, which was scribed in my presence. The recorded information has been reviewed and considered, and addended by me as needed.  Norberto SorensonEva Shaw, MD MPH  Results for orders placed or performed in visit on 02/09/15  TSH  Result Value Ref Range   TSH 1.947 0.350 - 4.500 uIU/mL  Vitamin B12  Result Value Ref Range   Vitamin B-12 383 211 - 911 pg/mL  Vit D  25 hydroxy (rtn osteoporosis monitoring)  Result Value Ref Range   Vit D, 25-Hydroxy 28 (L) 30 - 100 ng/mL  Sedimentation Rate  Result Value Ref Range   Sed Rate 1 0 - 15 mm/hr  POCT urinalysis dipstick  Result Value Ref Range   Color, UA yellow    Clarity, UA clear    Glucose, UA neg    Bilirubin, UA neg    Ketones, UA neg    Spec Grav, UA <=1.005    Blood, UA neg    pH, UA 6.0    Protein,  UA neg    Urobilinogen, UA 0.2    Nitrite, UA neg    Leukocytes, UA Negative Negative

## 2015-02-10 ENCOUNTER — Encounter: Payer: Self-pay | Admitting: Family Medicine

## 2015-02-10 LAB — SEDIMENTATION RATE: SED RATE: 1 mm/h (ref 0–15)

## 2015-02-10 LAB — VITAMIN D 25 HYDROXY (VIT D DEFICIENCY, FRACTURES): VIT D 25 HYDROXY: 28 ng/mL — AB (ref 30–100)

## 2015-02-15 ENCOUNTER — Telehealth: Payer: Self-pay

## 2015-02-15 NOTE — Telephone Encounter (Signed)
Pt called about labs. LMOM that letter was sent and what it said.

## 2015-03-28 ENCOUNTER — Ambulatory Visit: Payer: BLUE CROSS/BLUE SHIELD | Admitting: Cardiovascular Disease

## 2015-06-28 ENCOUNTER — Ambulatory Visit (INDEPENDENT_AMBULATORY_CARE_PROVIDER_SITE_OTHER): Payer: BLUE CROSS/BLUE SHIELD | Admitting: Family Medicine

## 2015-06-28 VITALS — BP 102/66 | HR 66 | Temp 98.5°F | Resp 16 | Ht 66.0 in | Wt 140.4 lb

## 2015-06-28 DIAGNOSIS — J069 Acute upper respiratory infection, unspecified: Secondary | ICD-10-CM

## 2015-06-28 NOTE — Patient Instructions (Signed)
Upper Respiratory Infection, Adult Most upper respiratory infections (URIs) are a viral infection of the air passages leading to the lungs. A URI affects the nose, throat, and upper air passages. The most common type of URI is nasopharyngitis and is typically referred to as "the common cold." URIs run their course and usually go away on their own. Most of the time, a URI does not require medical attention, but sometimes a bacterial infection in the upper airways can follow a viral infection. This is called a secondary infection. Sinus and middle ear infections are common types of secondary upper respiratory infections. Bacterial pneumonia can also complicate a URI. A URI can worsen asthma and chronic obstructive pulmonary disease (COPD). Sometimes, these complications can require emergency medical care and may be life threatening.  CAUSES Almost all URIs are caused by viruses. A virus is a type of germ and can spread from one person to another.  RISKS FACTORS You may be at risk for a URI if:   You smoke.   You have chronic heart or lung disease.  You have a weakened defense (immune) system.   You are very young or very old.   You have nasal allergies or asthma.  You work in crowded or poorly ventilated areas.  You work in health care facilities or schools. SIGNS AND SYMPTOMS  Symptoms typically develop 2-3 days after you come in contact with a cold virus. Most viral URIs last 7-10 days. However, viral URIs from the influenza virus (flu virus) can last 14-18 days and are typically more severe. Symptoms may include:   Runny or stuffy (congested) nose.   Sneezing.   Cough.   Sore throat.   Headache.   Fatigue.   Fever.   Loss of appetite.   Pain in your forehead, behind your eyes, and over your cheekbones (sinus pain).  Muscle aches.  DIAGNOSIS  Your health care provider may diagnose a URI by:  Physical exam.  Tests to check that your symptoms are not due to  another condition such as:  Strep throat.  Sinusitis.  Pneumonia.  Asthma. TREATMENT  A URI goes away on its own with time. It cannot be cured with medicines, but medicines may be prescribed or recommended to relieve symptoms. Medicines may help:  Reduce your fever.  Reduce your cough.  Relieve nasal congestion. HOME CARE INSTRUCTIONS   Take medicines only as directed by your health care provider.   Gargle warm saltwater or take cough drops to comfort your throat as directed by your health care provider.  Use a warm mist humidifier or inhale steam from a shower to increase air moisture. This may make it easier to breathe.  Drink enough fluid to keep your urine clear or pale yellow.   Eat soups and other clear broths and maintain good nutrition.   Rest as needed.   Return to work when your temperature has returned to normal or as your health care provider advises. You may need to stay home longer to avoid infecting others. You can also use a face mask and careful hand washing to prevent spread of the virus.  Increase the usage of your inhaler if you have asthma.   Do not use any tobacco products, including cigarettes, chewing tobacco, or electronic cigarettes. If you need help quitting, ask your health care provider. PREVENTION  The best way to protect yourself from getting a cold is to practice good hygiene.   Avoid oral or hand contact with people with cold   symptoms.   Wash your hands often if contact occurs.  There is no clear evidence that vitamin C, vitamin E, echinacea, or exercise reduces the chance of developing a cold. However, it is always recommended to get plenty of rest, exercise, and practice good nutrition.  SEEK MEDICAL CARE IF:   You are getting worse rather than better.   Your symptoms are not controlled by medicine.   You have chills.  You have worsening shortness of breath.  You have brown or red mucus.  You have yellow or brown nasal  discharge.  You have pain in your face, especially when you bend forward.  You have a fever.  You have swollen neck glands.  You have pain while swallowing.  You have white areas in the back of your throat. SEEK IMMEDIATE MEDICAL CARE IF:   You have severe or persistent:  Headache.  Ear pain.  Sinus pain.  Chest pain.  You have chronic lung disease and any of the following:  Wheezing.  Prolonged cough.  Coughing up blood.  A change in your usual mucus.  You have a stiff neck.  You have changes in your:  Vision.  Hearing.  Thinking.  Mood. MAKE SURE YOU:   Understand these instructions.  Will watch your condition.  Will get help right away if you are not doing well or get worse.   This information is not intended to replace advice given to you by your health care provider. Make sure you discuss any questions you have with your health care provider.   Document Released: 01/07/2001 Document Revised: 11/28/2014 Document Reviewed: 10/19/2013 Elsevier Interactive Patient Education 2016 [employer] Inc[employer]  

## 2015-06-28 NOTE — Progress Notes (Signed)
Subjective:    Patient ID: Justin Ellis, male    DOB: March 04, 1986, 29 y.o.   MRN: [id number]  06/28/2015  Doctor note   HPI This 30 y.o. male presents for evaluation of cold symptoms. Onset five days ago.  Onset fever, headache, rhinorrhea.  Missed two days of work and needs note.  Last fever two days ago.  No sweats or chills.  No headache now.  ST initially upon waking and now resolved.  No ear pain. +rhinorrhea clear. +nasal congestion mild.  Mild cough.  No SOB.  No wheezing.  No v/d.  Taking Dayquil and Nyquil.  Quit smoking.  Diagnosed with POTS by PCP; did not undergo cardiology evaluation yet.    PCP:  In [place].    Review of Systems  Constitutional: Negative for fever, chills, diaphoresis and fatigue.  HENT: Positive for congestion, postnasal drip, rhinorrhea and voice change. Negative for ear pain, sinus pressure, sore throat and trouble swallowing.   Respiratory: Positive for cough. Negative for shortness of breath and wheezing.   Gastrointestinal: Negative for nausea, vomiting, abdominal pain and diarrhea.  Skin: Negative for rash.  Neurological: Negative for headaches.    Past Medical History  Diagnosis Date  . Vasovagal near syncope 03/10/2012   History reviewed. No pertinent past surgical history. No Known Allergies Current Outpatient Prescriptions  Medication Sig Dispense Refill  . Aspirin-Salicylamide-Caffeine (BC HEADACHE POWDER PO) Take 1 packet by mouth every 6 (six) hours as needed (for headache/pain.).    Marland Kitchen pseudoephedrine (SUDAFED 12 HOUR) 120 MG 12 hr tablet Take 120 mg by mouth daily.     No current facility-administered medications for this visit.   Social History   Social History  . Marital Status: Single    Spouse Name: N/A  . Number of Children: N/A  . Years of Education: N/A   Occupational History  . Not on file.   Social History Main Topics  . Smoking status: Former Smoker -- 1.00 packs/day for 3 years    Types: Cigarettes  . Smokeless  tobacco: Not on file  . Alcohol Use: 0.0 oz/week    0 Standard drinks or equivalent per week  . Drug Use: Not on file  . Sexual Activity: Not on file   Other Topics Concern  . Not on file   Social History Narrative   History reviewed. No pertinent family history.     Objective:    BP 102/66 mmHg  Pulse 66  Temp(Src) 98.5 F (36.9 C) (Oral)  Resp 16  Ht  (1.676 m)  Wt 140 lb 6.4 oz (63.685 kg)  BMI 22.67 kg/m2  SpO2 98% Physical Exam  Constitutional: He is oriented to person, place, and time. He appears well-developed and well-nourished. No distress.  HENT:  Head: Normocephalic and atraumatic.  Right Ear: Tympanic membrane, external ear and ear canal normal.  Left Ear: Tympanic membrane, external ear and ear canal normal.  Nose: Mucosal edema and rhinorrhea present. Right sinus exhibits no maxillary sinus tenderness and no frontal sinus tenderness. Left sinus exhibits no maxillary sinus tenderness and no frontal sinus tenderness.  Eyes: Conjunctivae and EOM are normal. Pupils are equal, round, and reactive to light.  Neck: Normal range of motion. Neck supple. Carotid bruit is not present. No thyromegaly present.  Cardiovascular: Normal rate, regular rhythm, normal heart sounds and intact distal pulses.  Exam reveals no gallop and no friction rub.   No murmur heard. Pulmonary/Chest: Effort normal and breath sounds normal. He has no  wheezes. He has no rales.  Lymphadenopathy:    He has no cervical adenopathy.  Neurological: He is alert and oriented to person, place, and time. No cranial nerve deficit.  Skin: Skin is warm and dry. No rash noted. He is not diaphoretic.  Psychiatric: He has a normal mood and affect. His behavior is normal.  Nursing note and vitals reviewed.  Results for orders placed or performed in visit on 02/09/15  TSH  Result Value Ref Range   TSH 1.947 0.350 - 4.500 uIU/mL  Vitamin B12  Result Value Ref Range   Vitamin B-12 383 211 - 911 pg/mL    Vit D  25 hydroxy (rtn osteoporosis monitoring)  Result Value Ref Range   Vit D, 25-Hydroxy 28 (L) 30 - 100 ng/mL  Sedimentation Rate  Result Value Ref Range   Sed Rate 1 0 - 15 mm/hr  POCT urinalysis dipstick  Result Value Ref Range   Color, UA yellow    Clarity, UA clear    Glucose, UA neg    Bilirubin, UA neg    Ketones, UA neg    Spec Grav, UA <=1.005    Blood, UA neg    pH, UA 6.0    Protein, UA neg    Urobilinogen, UA 0.2    Nitrite, UA neg    Leukocytes, UA Negative Negative       Assessment & Plan:   1. Acute upper respiratory infection    -New. -recommend continued OTC medication for symptomatic relief. Recommend decongestant scheduled for the next 5 days. -OOW note provided for two days. -RTC for acute worsening.   No orders of the defined types were placed in this encounter.   No orders of the defined types were placed in this encounter.    Return if symptoms worsen or fail to improve.     Justin Ellis Justin Ellis, M.D. [facility]  [place] [address] [place], [place]  [id number] [phone] phone [phone] fax

## 2016-07-09 IMAGING — CR DG KNEE 1-2V*R*
2 series · 2 of 2 positions shown · non-contrast
Comparison: None.

CLINICAL DATA: Right knee pain.

EXAM:
RIGHT KNEE - 1-2 VIEW

[lateral]
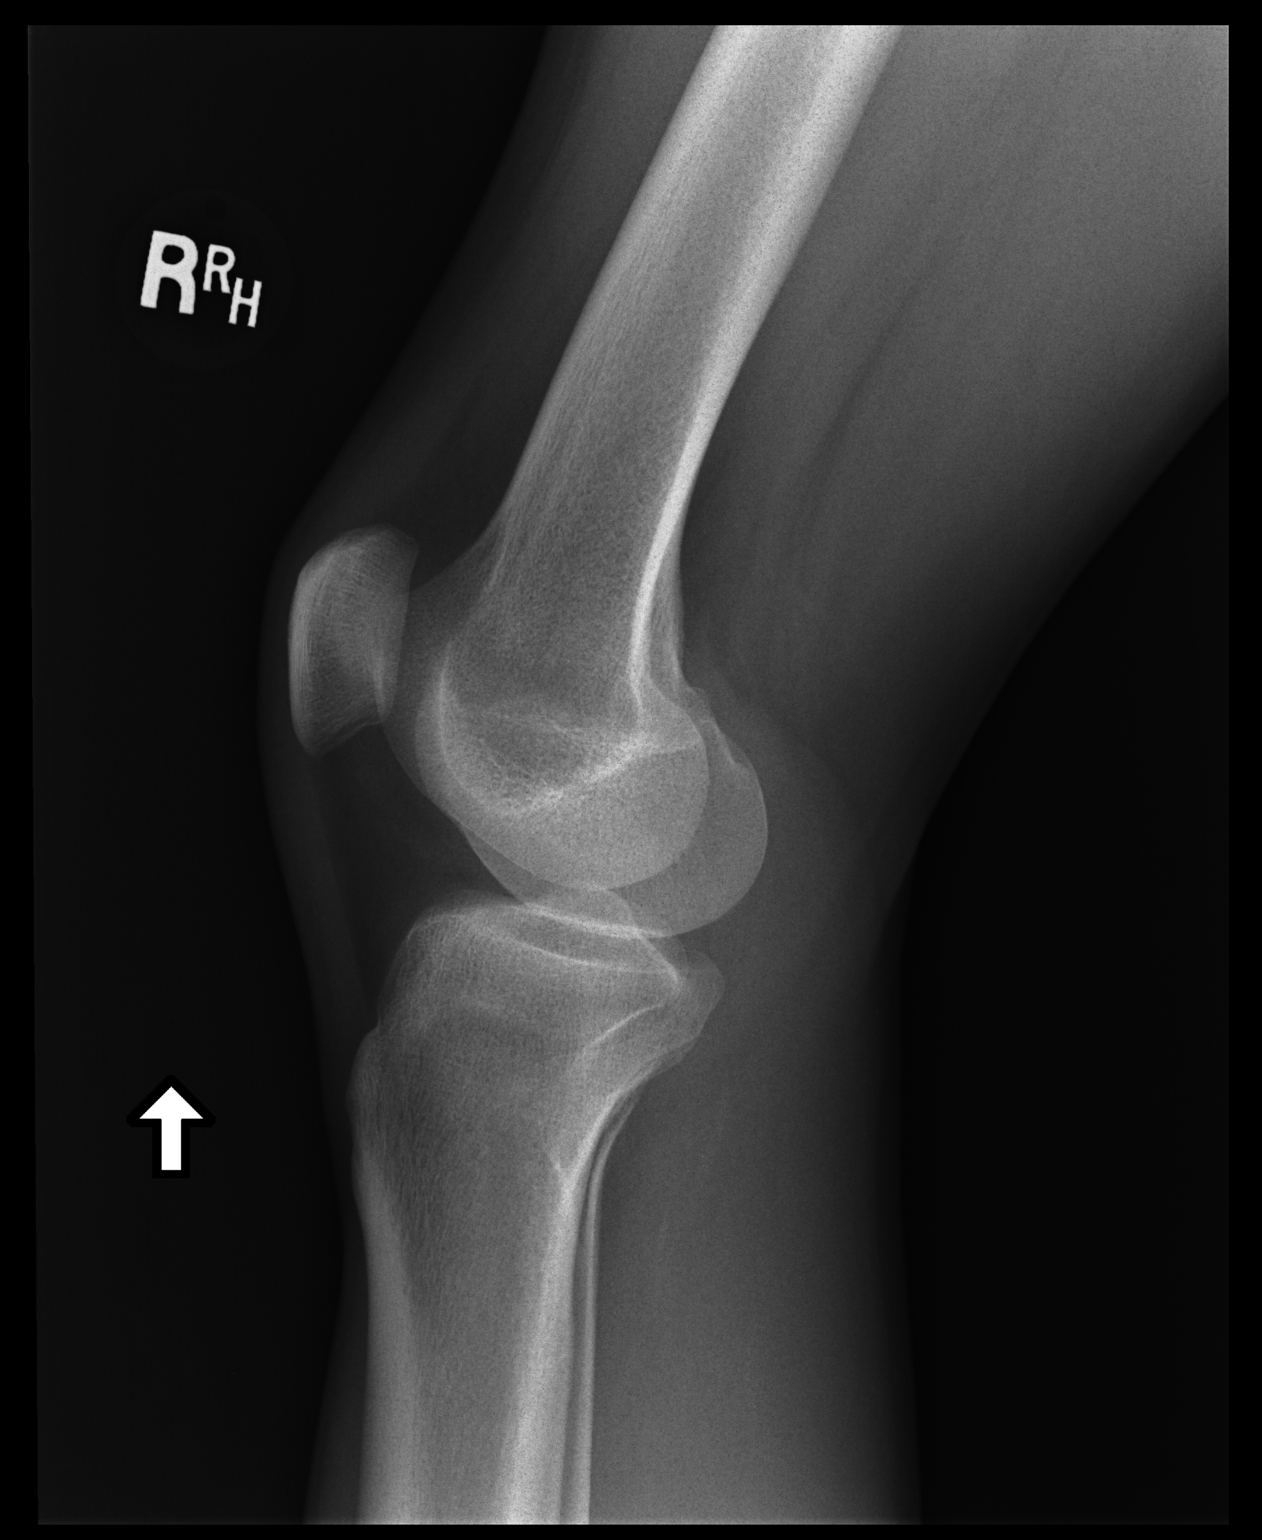

[AP]
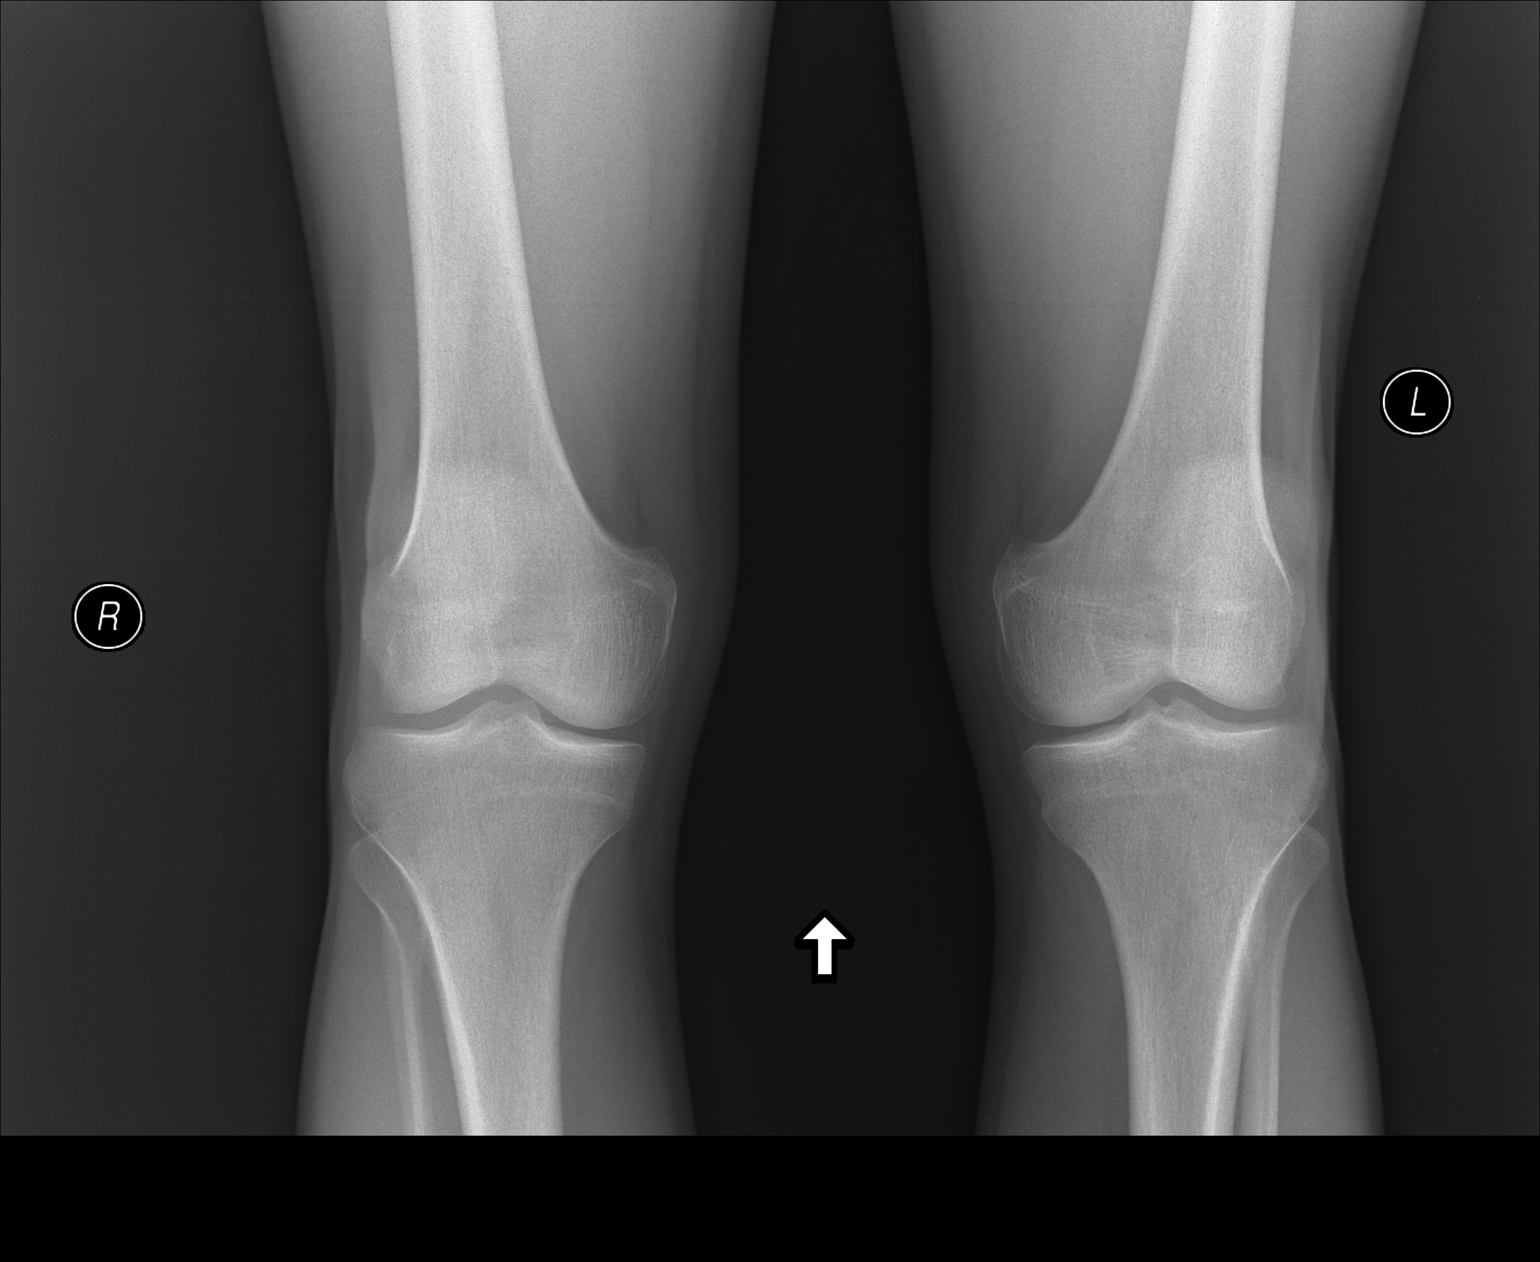

[2 of 2 positions shown; findings below may reference images not displayed]

FINDINGS: The Insall-Salvati ratio is 1.14, at the upper limits of normal,
indicating at the patient does not have patella alta.

The osseous structures of the right knee are normal. No joint
effusion.
IMPRESSION: Normal exam.

## 2016-07-09 IMAGING — CR DG KNEE 1-2V*L*
1 series · 1 of 1 positions shown · non-contrast
Comparison: None.

CLINICAL DATA: 28-year-old male with bilateral knee pain

EXAM:
LEFT KNEE - 1-2 VIEW

[lateral]
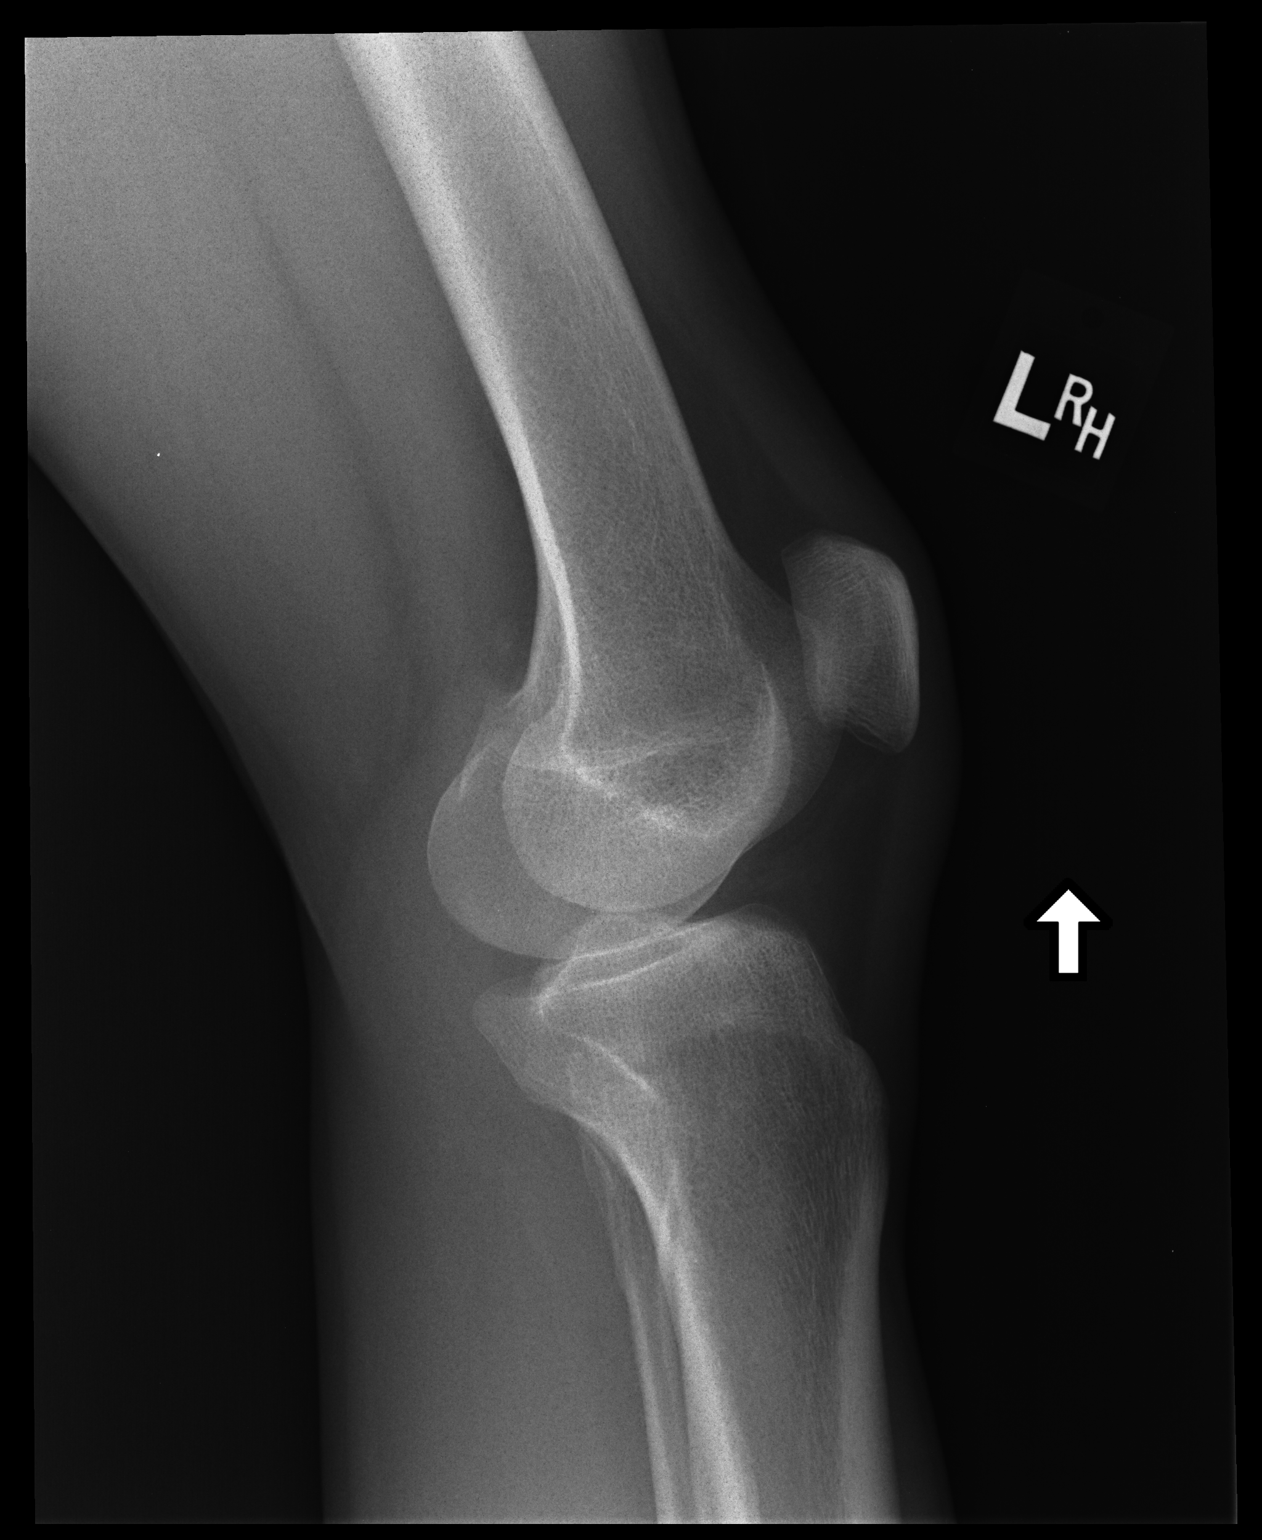

[1 of 1 positions shown; findings below may reference images not displayed]

FINDINGS: There is no evidence of fracture, dislocation, or joint effusion.
There is no evidence of arthropathy or other focal bone abnormality.
The patella appears to be within upper limits of normal indication.
All Soft tissues are unremarkable.
IMPRESSION: No acute fracture dislocation.

## 2017-03-03 ENCOUNTER — Encounter: Payer: Self-pay | Admitting: Allergy & Immunology

## 2017-03-03 ENCOUNTER — Ambulatory Visit (INDEPENDENT_AMBULATORY_CARE_PROVIDER_SITE_OTHER): Payer: Managed Care, Other (non HMO) | Admitting: Allergy & Immunology

## 2017-03-03 VITALS — BP 110/70 | HR 72 | Temp 98.0°F | Resp 16 | Ht 64.76 in | Wt 131.2 lb

## 2017-03-03 DIAGNOSIS — R0602 Shortness of breath: Secondary | ICD-10-CM | POA: Diagnosis not present

## 2017-03-03 DIAGNOSIS — J310 Chronic rhinitis: Secondary | ICD-10-CM

## 2017-03-03 DIAGNOSIS — J3089 Other allergic rhinitis: Secondary | ICD-10-CM | POA: Diagnosis not present

## 2017-03-03 DIAGNOSIS — T781XXD Other adverse food reactions, not elsewhere classified, subsequent encounter: Secondary | ICD-10-CM | POA: Diagnosis not present

## 2017-03-03 DIAGNOSIS — T781XXA Other adverse food reactions, not elsewhere classified, initial encounter: Secondary | ICD-10-CM | POA: Insufficient documentation

## 2017-03-03 MED ORDER — AZELASTINE HCL 0.1 % NA SOLN: 2.0000 | Freq: Two times a day (BID) | NASAL | 5 refills | Status: DC

## 2017-03-03 MED ORDER — FLUTICASONE PROPIONATE 50 MCG/ACT NA SUSP: 2.0000 | Freq: Every day | NASAL | 5 refills | Status: DC

## 2017-03-03 NOTE — Patient Instructions (Signed)
1. SOB (shortness of breath) - Spirometry (lung function testing) was normal today. - There are no other signs of asthma in your history, therefore we will hold off on any treatment for asthma until the next visit. - If you continue to have shortness of breath at the next visit, we will consider starting asthma medications to see if this helps.  2. Chronic rhinitis - Testing today showed: weeds, grasses, molds, dust mites, cat and dog - Avoidance measures provided. - Start Allegra (fexofenadine) 180mg  table once daily and Dymista (fluticasone/azelastine) two sprays per nostril 1-2 times daily as needed. - We will likely have to split the Dymista into two nasal sprays when we send it in, as many insurances do not cover Dymista initially until you have failed the separate nasal sprays.  - You can use an extra dose of the antihistamine, if needed, for breakthrough symptoms.  - Consider nasal saline rinses 1-2 times daily to remove allergens from the nasal cavities as well as help with mucous clearance (this is especially helpful to do before the nasal sprays are given) - Consider allergy shots as a means of long-term control. - Allergy shots "re-train" the immune system to ignore environmental allergens and decrease the resulting immune response to those allergens (sneezing, itchy watery eyes, runny nose, nasal congestion, etc).   - We can discuss more at the next appointment if the medications are not working for you.  3. Adverse food reaction (dairy, wheat) - with negative testing - Testing to the most common foods was negative (peanut, tree nut, soy, fish mix, shellfish mix, wheat, milk, egg). - There is a the low positive predictive value of food allergy testing and hence the high possibility of false positives. - In contrast, food allergy testing has a high negative predictive value, therefore if testing is negative we can be relatively assured that they are indeed negative.  - However, testing  today did not rule out food intolerances, which are different than allergies. - Try getting cow's milk and wheat (and other triggering carbohydrates) out of your diet to see if this helps your symptoms.  4. Return in about 2 months (around 05/03/2017).   Please inform us of any Emergency Department visits, hospitalizations, or changes in symptoms. Call us before going to the ED for breathing or allergy symptoms since we might be able to fit you in for a sick visit. Feel free to contact us anytime with any questions, problems, or concerns.  It was a pleasure to meet you today! Enjoy the rest of your summer!   Websites that have reliable patient information: 1. American Academy of Asthma, Allergy, and Immunology: www.aaaai.org 2. Food Allergy Research and Education (FARE): foodallergy.org 3. Mothers of Asthmatics: http://www.asthmacommunitynetwork.org 4. American College of Allergy, Asthma, and Immunology: www.acaai.org  Reducing Pollen Exposure  The American Academy of Allergy, Asthma and Immunology suggests the following steps to reduce your exposure to pollen during allergy seasons.    1. Do not hang sheets or clothing out to dry; pollen may collect on these items. 2. Do not mow lawns or spend time around freshly cut grass; mowing stirs up pollen. 3. Keep windows closed at night.  Keep car windows closed while driving. 4. Minimize morning activities outdoors, a time when pollen counts are usually at their highest. 5. Stay indoors as much as possible when pollen counts or humidity is high and on windy days when pollen tends to remain in the air longer. 6. Use air conditioning when possible.  Many air conditioners have filters that trap the pollen spores. 7. Use a HEPA room air filter to remove pollen form the indoor air you breathe.  Control of Mold Allergen  Mold and fungi can grow on a variety of surfaces provided certain temperature and moisture conditions exist.  Outdoor molds grow  on plants, decaying vegetation and soil.  The major outdoor mold, Alternaria and Cladosporium, are found in very high numbers during hot and dry conditions.  Generally, a late Summer - Fall peak is seen for common outdoor fungal spores.  Rain will temporarily lower outdoor mold spore count, but counts rise rapidly when the rainy period ends.  The most important indoor molds are Aspergillus and Penicillium.  Dark, humid and poorly ventilated basements are ideal sites for mold growth.  The next most common sites of mold growth are the bathroom and the kitchen.  Outdoor [employer] 1. Use air conditioning and keep windows closed 2. Avoid exposure to decaying vegetation. 3. Avoid leaf raking. 4. Avoid grain handling. 5. Consider wearing a face mask if working in moldy areas.  Indoor Mold Control 1. Maintain humidity below 50%. 2. Clean washable surfaces with 5% bleach solution. 3. Remove sources e.g. contaminated carpets.  Control of House Dust Mite Allergen    House dust mites play a major role in allergic asthma and rhinitis.  They occur in environments with high humidity wherever human skin, the food for dust mites is found. High levels have been detected in dust obtained from mattresses, pillows, carpets, upholstered furniture, bed covers, clothes and soft toys.  The principal allergen of the house dust mite is found in its feces.  A gram of dust may contain 1,000 mites and 250,000 fecal particles.  Mite antigen is easily measured in the air during house cleaning activities.    1. Encase mattresses, including the box spring, and pillow, in an air tight cover.  Seal the zipper end of the encased mattresses with wide adhesive tape. 2. Wash the bedding in water of 130 degrees Farenheit weekly.  Avoid cotton comforters/quilts and flannel bedding: the most ideal bed covering is the dacron comforter. 3. Remove all upholstered furniture from the bedroom. 4. Remove carpets, carpet padding, rugs, and  non-washable window drapes from the bedroom.  Wash drapes weekly or use plastic window coverings. 5. Remove all non-washable stuffed toys from the bedroom.  Wash stuffed toys weekly. 6. Have the room cleaned frequently with a vacuum cleaner and a damp dust-mop.  The patient should not be in a room which is being cleaned and should wait 1 hour after cleaning before going into the room. 7. Close and seal all heating outlets in the bedroom.  Otherwise, the room will become filled with dust-laden air.  An electric heater can be used to heat the room. 8. Reduce indoor humidity to less than 50%.  Do not use a humidifier.  Control of Dog or Cat Allergen  Avoidance is the best way to manage a dog or cat allergy. If you have a dog or cat and are allergic to dog or cats, consider removing the dog or cat from the home. If you have a dog or cat but don't want to find it a new home, or if your family wants a pet even though someone in the household is allergic, here are some strategies that may help keep symptoms at bay:  1. Keep the pet out of your bedroom and restrict it to only a few rooms. Be advised  that keeping the dog or cat in only one room will not limit the allergens to that room. 2. Don't pet, hug or kiss the dog or cat; if you do, wash your hands with soap and water. 3. High-efficiency particulate air (HEPA) cleaners run continuously in a bedroom or living room can reduce allergen levels over time. 4. Regular use of a high-efficiency vacuum cleaner or a central vacuum can reduce allergen levels. 5. Giving your dog or cat a bath at least once a week can reduce airborne allergen.

## 2017-03-03 NOTE — Progress Notes (Signed)
NEW PATIENT  Date of Service/Encounter:  03/03/17  Referring provider: Patient, No Pcp Per   Assessment:   SOB (shortness of breath)  Chronic rhinitis (weeds, grasses, molds, dust mites, cat and dog)  Adverse food reaction (wheat, cow's milk) - with negative testing today   Plan/Recommendations:   1. SOB (shortness of breath) - Spirometry (lung function testing) was normal today. - There are no other signs of asthma in your history, therefore we will hold off on any treatment for asthma until the next visit. - If you continue to have shortness of breath at the next visit, we will consider starting asthma medications to see if this helps.  2. Chronic rhinitis - Testing today showed: weeds, grasses, molds, dust mites, cat and dog - Avoidance measures provided. - Start Allegra (fexofenadine) 180mg  table once daily and Dymista (fluticasone/azelastine) two sprays per nostril 1-2 times daily as needed. - We will likely have to split the Dymista into two nasal sprays when we send it in, as many insurances do not cover Dymista initially until you have failed the separate nasal sprays.  - You can use an extra dose of the antihistamine, if needed, for breakthrough symptoms.  - Consider nasal saline rinses 1-2 times daily to remove allergens from the nasal cavities as well as help with mucous clearance (this is especially helpful to do before the nasal sprays are given) - Consider allergy shots as a means of long-term control. - Allergy shots "re-train" the immune system to ignore environmental allergens and decrease the resulting immune response to those allergens (sneezing, itchy watery eyes, runny nose, nasal congestion, etc).   - We can discuss more at the next appointment if the medications are not working for you.  3. Adverse food reaction (dairy, wheat) - with negative testing - Testing to the most common foods was negative (peanut, tree nut, soy, fish mix, shellfish mix, wheat,  milk, egg). - There is a the low positive predictive value of food allergy testing and hence the high possibility of false positives. - In contrast, food allergy testing has a high negative predictive value, therefore if testing is negative we can be relatively assured that they are indeed negative.  - However, testing today did not rule out food intolerances, which are different than allergies. - Try getting cow's milk and wheat (and other triggering carbohydrates) out of your diet to see if this helps your symptoms.  4. Return in about 2 months (around 05/03/2017).    Subjective:   Justin Ellis is a 31 y.o. male presenting today for evaluation of  Chief Complaint  Patient presents with  . Headache  . Nasal Congestion  . Sinus Problem    Justin Ellis has a history of the following: Patient Active Problem List   Diagnosis Date Noted  . Vasovagal near syncope 03/10/2012    History obtained from: chart review and patient.  [name] was referred by Patient, No Pcp Per.     [name] is a 31 y.o. male presenting for an allergy evaluation. He reports that he has had sinus pressure throughout the year. He estimates that his frequency of sinus infections have improved since quitting smoking. He estimates that he gets treated around once per year. He denies itch watery eyes. He tends to have more congestion and pain rather that runny nose.     He has used nasal saline with some relief. He has never tried a nasal steroid. He has tried Benadryl which does provide some  relief of his symptoms. He denies postnasal drip. He has never been allergy tested. He has been a smoker but quit two years ago. He does feel that he is operating at 50% capacity. He continues to vape. He does not use an inhaler and has never needed one. He does not cough at night, but he just feels as if he cannot take a full breath. He does not notice that this feeling worsens in any particular environment. He has never  needed prednisone for breathing and has never needed any prednisone for his breathing.   He has no problems with her workplace environment at all. 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 He has an Control and instrumentation engineerundergraduate in [place] and is now working in a completely Development worker, communitydifferent field. He would like to go into teaching in the future, but unfortunately the teaching environment is rather unforgiving at this time.    He does feel that he is "sensitive" to "something". In particular he is worried about dairy and carbs. He tolerates peanut, tree nuts, eggs, and seafood. He has never needed an ED visit or epinephrine for any type of reaction. Otherwise, there is no history of other atopic diseases, including drug allergies, stinging insect allergies, or urticaria. There is no significant infectious history. Vaccinations are up to date.     Past Medical History: Patient Active Problem List   Diagnosis Date Noted  . Vasovagal near syncope 03/10/2012    Medication List:  Allergies as of 03/03/2017   No Known Allergies     Medication List       Accurate as of 03/03/17 12:57 PM. Always use your most recent med list.          azelastine 0.1 % nasal spray Commonly known as:  ASTELIN Place 2 sprays into both nostrils 2 (two) times daily.   BC HEADACHE POWDER PO Take 1 packet by mouth every 6 (six) hours as needed (for headache/pain.).   fluticasone 50 MCG/ACT nasal spray Commonly known as:  FLONASE Place 2 sprays into both nostrils daily.   SUDAFED 12 HOUR 120 MG 12 hr tablet Generic drug:  pseudoephedrine Take 120 mg by mouth daily.       Birth History: non-contributory.  Developmental History: non-contributory.   Past Surgical History: Past Surgical History:  Procedure Laterality Date  . NO PAST SURGERIES       Family History: Family History  Problem Relation Age of Onset  . Allergic rhinitis Sister   . Allergic rhinitis Brother   .  Allergic rhinitis Maternal Aunt   . Allergic rhinitis Maternal Uncle   . Allergic rhinitis Brother   . Allergic rhinitis Brother   . Allergic rhinitis Brother   . Allergic rhinitis Brother   . Angioedema Neg Hx   . Asthma Neg Hx   . Atopy Neg Hx   . Eczema Neg Hx   . Immunodeficiency Neg Hx   . Urticaria Neg Hx      Social History: Molli HazardMatthew lives at home with his wife and 2.5864yr old daughter. They live in a 29 year old home that they purchased approximately 2 years ago. Despite the age, he has never noticed any mildew or roaches in the home. They have hardwoods throughout the home. They have gas heating and central and window units for air conditioning. There are no animals inside or outside of the home. They have no dust mite coverings on the bedding. There is currently no tobacco exposure,  but he does have a 5 year history of smoking. He quit smoking in January 2016 and has been vaping since that time.     Review of Systems: a 14-point review of systems is pertinent for what is mentioned in HPI.  Otherwise, all other systems were negative. Constitutional: negative other than that listed in the HPI Eyes: negative other than that listed in the HPI Ears, nose, mouth, throat, and face: negative other than that listed in the HPI Respiratory: negative other than that listed in the HPI Cardiovascular: negative other than that listed in the HPI Gastrointestinal: negative other than that listed in the HPI Genitourinary: negative other than that listed in the HPI Integument: negative other than that listed in the HPI Hematologic: negative other than that listed in the HPI Musculoskeletal: negative other than that listed in the HPI Neurological: negative other than that listed in the HPI Allergy/Immunologic: negative other than that listed in the HPI    Objective:   Blood pressure 110/70, pulse 72, temperature 98 F (36.7 C), temperature source Oral, resp. rate 16, height 5' 4.76" (1.645  m), weight 131 lb 3.2 oz (59.5 kg), SpO2 98 %. Body mass index is 21.99 kg/m.   Physical Exam:  General: Alert, interactive, in no acute distress. Pleasant male.  Eyes: No conjunctival injection present on the right, No conjunctival injection present on the left, PERRL bilaterally, No discharge on the right, No discharge on the left, No Horner-Trantas dots present and allergic shiners present bilaterally Ears: Right TM pearly gray with normal light reflex, Left TM pearly gray with normal light reflex, Right TM intact without perforation and Left TM intact without perforation.  Nose/Throat: External nose within normal limits and septum midline, turbinates edematous and pale with clear discharge, post-pharynx erythematous with cobblestoning in the posterior oropharynx. Tonsils 2+ without exudates Neck: Supple without thyromegaly.  Adenopathy: Shoddy bilateral anterior cervical lymphadenopathy. and No enlarged lymph nodes appreciated in the occipital, axillary, epitrochlear, inguinal, or popliteal regions. Lungs: Clear to auscultation without wheezing, rhonchi or rales. No increased work of breathing. CV: Normal S1/S2, no murmurs. Capillary refill <2 seconds.  Abdomen: Nondistended, nontender. No guarding or rebound tenderness. Bowel sounds present in all fields and hypoactive  Skin: Warm and dry, without lesions or rashes. Multiple freckles present.  Extremities:  No clubbing, cyanosis or edema. Neuro:   Grossly intact. No focal deficits appreciated. Responsive to questions.  Diagnostic studies:   Spirometry: results normal (FEV1: 4.44/117%, FVC: 6.14/138%, FEV1/FVC: 72%).    Spirometry consistent with normal pattern.   Allergy Studies:   Indoor/Outdoor Percutaneous Adult Environmental Panel: negative to the entire panel with adequate controls.  Indoor/Outdoor Selected Intradermal Environmental Panel: positive to [place] grass, Johnson grass, Grass mix, ragweed mix, mold mix #3, mold mix  #4, cat, dog and mite mix. The reactions to ragweed and cat with 1+. Otherwise negative with adequate controls.  Most Common Foods Panel (peanut, cashew, soy, fish mix, shellfish mix, wheat, milk, egg): negative to all with adequate controls     Malachi Bonds, MD [facility]

## 2017-03-06 NOTE — Addendum Note (Signed)
Addended by: Marthann SchillerLIPFORD, Brittannie Tawney C on: 03/06/2017 09:22 AM   Modules accepted: Orders

## 2017-04-23 NOTE — Discharge Instructions (Signed)
[facility] °[facility] °ENDOSCOPIC SINUS SURGERY °[facility] ° °What is Functional Endoscopic Sinus Surgery? ° The Surgery involves making the natural openings of the sinuses larger by removing the bony partitions that separate the sinuses from the nasal cavity.  The natural sinus lining is preserved as much as possible to allow the sinuses to resume normal function after the surgery.  In some patients nasal polyps (excessively swollen lining of the sinuses) may be removed to relieve obstruction of the sinus openings.  The surgery is performed through the nose using lighted scopes, which eliminates the need for incisions on the face.  A septoplasty is a different procedure which is sometimes performed with sinus surgery.  It involves straightening the boy partition that separates the two sides of your nose.  A crooked or deviated septum may need repair if is obstructing the sinuses or nasal airflow.  Turbinate reduction is also often performed during sinus surgery.  The turbinates are bony proturberances from the side walls of the nose which swell and can obstruct the nose in patients with sinus and allergy problems.  Their size can be surgically reduced to help relieve nasal obstruction. ° °What Can Sinus Surgery Do For Me? ° Sinus surgery can reduce the frequency of sinus infections requiring antibiotic treatment.  This can provide improvement in nasal congestion, post-nasal drainage, facial pressure and nasal obstruction.  Surgery will NOT prevent you from ever having an infection again, so it usually only for patients who get infections 4 or more times yearly requiring antibiotics, or for infections that do not clear with antibiotics.  It will not cure nasal allergies, so patients with allergies may still require medication to treat their allergies after surgery. Surgery may improve headaches related to sinusitis, however, some people will continue to  require medication to control sinus headaches related to allergies.  Surgery will do nothing for other forms of headache (migraine, tension or cluster). ° °What Are the Risks of Endoscopic Sinus Surgery? ° Current techniques allow surgery to be performed safely with little risk, however, there are rare complications that patients should be aware of.  Because the sinuses are located around the eyes, there is risk of eye injury, including blindness, though again, this would be quite rare. This is usually a result of bleeding behind the eye during surgery, which puts the vision oat risk, though there are treatments to protect the vision and prevent permanent disrupted by surgery causing a leak of the spinal fluid that surrounds the brain.  More serious complications would include bleeding inside the brain cavity or damage to the brain.  Again, all of these complications are uncommon, and spinal fluid leaks can be safely managed surgically if they occur.  The most common complication of sinus surgery is bleeding from the nose, which may require packing or cauterization of the nose.  Continued sinus have polyps may experience recurrence of the polyps requiring revision surgery.  Alterations of sense of smell or injury to the tear ducts are also rare complications.  ° °What is the Surgery Like, and what is the Recovery? ° The Surgery usually takes a couple of hours to perform, and is usually performed under a general anesthetic (completely asleep).  Patients are usually discharged home after a couple of hours.  Sometimes during surgery it is necessary to pack the nose to control bleeding, and the packing is left in place for 24 - 48 hours, and removed by your surgeon.    If a septoplasty was performed during the procedure, there is often a splint placed which must be removed after 5-7 days.   °Discomfort: Pain is usually mild to moderate, and can be controlled by prescription pain medication or acetaminophen (Tylenol).   Aspirin, Ibuprofen (Advil, Motrin), or Naprosyn (Aleve) should be avoided, as they can cause increased bleeding.  Most patients feel sinus pressure like they have a bad head cold for several days.  Sleeping with your head elevated can help reduce swelling and facial pressure, as can ice packs over the face.  A humidifier may be helpful to keep the mucous and blood from drying in the nose.  ° °Diet: There are no specific diet restrictions, however, you should generally start with clear liquids and a light diet of bland foods because the anesthetic can cause some nausea.  Advance your diet depending on how your stomach feels.  Taking your pain medication with food will often help reduce stomach upset which pain medications can cause. ° °Nasal Saline Irrigation: It is important to remove blood clots and dried mucous from the nose as it is healing.  This is done by having you irrigate the nose at least 3 - 4 times daily with a salt water solution.  We recommend using NeilMed Sinus Rinse (available at the drug store).  Fill the squeeze bottle with the solution, bend over a sink, and insert the tip of the squeeze bottle into the nose ½ of an inch.  Point the tip of the squeeze bottle towards the inside corner of the eye on the same side your irrigating.  Squeeze the bottle and gently irrigate the nose.  If you bend forward as you do this, most of the fluid will flow back out of the nose, instead of down your throat.   The solution should be warm, near body temperature, when you irrigate.   Each time you irrigate, you should use a full squeeze bottle.  ° °Note that if you are instructed to use Nasal Steroid Sprays at any time after your surgery, irrigate with saline BEFORE using the steroid spray, so you do not wash it all out of the nose. °Another product, Nasal Saline Gel (such as AYR Nasal Saline Gel) can be applied in each nostril 3 - 4 times daily to moisture the nose and reduce scabbing or crusting. ° °Bleeding:   Bloody drainage from the nose can be expected for several days, and patients are instructed to irrigate their nose frequently with salt water to help remove mucous and blood clots.  The drainage may be dark red or brown, though some fresh blood may be seen intermittently, especially after irrigation.  Do not blow you nose, as bleeding may occur. If you must sneeze, keep your mouth open to allow air to escape through your mouth. ° °If heavy bleeding occurs: Irrigate the nose with saline to rinse out clots, then spray the nose 3 - 4 times with Afrin Nasal Decongestant Spray.  The spray will constrict the blood vessels to slow bleeding.  Pinch the lower half of your nose shut to apply pressure, and lay down with your head elevated.  Ice packs over the nose may help as well. If bleeding persists despite these measures, you should notify your doctor.  Do not use the Afrin routinely to control nasal congestion after surgery, as it can result in worsening congestion and may affect healing.  ° ° ° °Activity: Return to work varies among patients. Most patients will be   out of work at least 5 - 7 days to recover.  Patient may return to work after they are off of narcotic pain medication, and feeling well enough to perform the functions of their job.  Patients must avoid heavy lifting (over 10 pounds) or strenuous physical for 2 weeks after surgery, so your employer may need to assign you to light duty, or keep you out of work longer if light duty is not possible.  NOTE: you should not drive, operate dangerous machinery, do any mentally demanding tasks or make any important legal or financial decisions while on narcotic pain medication and recovering from the general anesthetic.  °  °Call Your Doctor Immediately if You Have Any of the Following: °1. Bleeding that you cannot control with the above measures °2. Loss of vision, double vision, bulging of the eye or black eyes. °3. Fever over 101 degrees °4. Neck stiffness with  severe headache, fever, nausea and change in mental state. °You are always encourage to call anytime with concerns, however, please call with requests for pain medication refills during office hours. ° °Office Endoscopy: During follow-up visits your doctor will remove any packing or splints that may have been placed and evaluate and clean your sinuses endoscopically.  Topical anesthetic will be used to make this as comfortable as possible, though you may want to take your pain medication prior to the visit.  How often this will need to be done varies from patient to patient.  After complete recovery from the surgery, you may need follow-up endoscopy from time to time, particularly if there is concern of recurrent infection or nasal polyps. ° ° °General Anesthesia, Adult, Care After °These instructions provide you with information about caring for yourself after your procedure. Your health care provider may also give you more specific instructions. Your treatment has been planned according to current medical practices, but problems sometimes occur. Call your health care provider if you have any problems or questions after your procedure. °What can I expect after the procedure? °After the procedure, it is common to have: °· Vomiting. °· A sore throat. °· Mental slowness. ° °It is common to feel: °· Nauseous. °· Cold or shivery. °· Sleepy. °· Tired. °· Sore or achy, even in parts of your body where you did not have surgery. ° °Follow these instructions at home: °For at least 24 hours after the procedure: °· Do not: °? Participate in activities where you could fall or become injured. °? Drive. °? Use heavy machinery. °? Drink alcohol. °? Take sleeping pills or medicines that cause drowsiness. °? Make important decisions or sign legal documents. °? Take care of children on your own. °· Rest. °Eating and drinking °· If you vomit, drink water, juice, or soup when you can drink without vomiting. °· Drink enough fluid to  keep your urine clear or pale yellow. °· Make sure you have little or no nausea before eating solid foods. °· Follow the diet recommended by your health care provider. °General instructions °· Have a responsible adult stay with you until you are awake and alert. °· Return to your normal activities as told by your health care provider. Ask your health care provider what activities are safe for you. °· Take over-the-counter and prescription medicines only as told by your health care provider. °· If you smoke, do not smoke without supervision. °· Keep all follow-up visits as told by your health care provider. This is important. °Contact a health care provider if: °· You   continue to have nausea or vomiting at home, and medicines are not helpful. °· You cannot drink fluids or start eating again. °· You cannot urinate after 8-12 hours. °· You develop a skin rash. °· You have fever. °· You have increasing redness at the site of your procedure. °Get help right away if: °· You have difficulty breathing. °· You have chest pain. °· You have unexpected bleeding. °· You feel that you are having a life-threatening or urgent problem. °This information is not intended to replace advice given to you by your health care provider. Make sure you discuss any questions you have with your health care provider. °Document Released: 10/20/2000 Document Revised: 12/17/2015 Document Reviewed: 06/28/2015 °Elsevier Interactive Patient Education © 2018 [employer] ° °

## 2017-04-27 ENCOUNTER — Encounter: Payer: Self-pay | Admitting: *Deleted

## 2017-04-30 ENCOUNTER — Ambulatory Visit: Payer: Managed Care, Other (non HMO) | Admitting: Anesthesiology

## 2017-04-30 ENCOUNTER — Encounter
Admission: RE | Disposition: A | Discharge status: Home / Self Care | Payer: Self-pay | Source: Ambulatory Visit | Attending: Otolaryngology

## 2017-04-30 ENCOUNTER — Ambulatory Visit
Admission: RE | Admit: 2017-04-30 | Discharge: 2017-04-30 | Disposition: A | Discharge status: Home / Self Care | Payer: Managed Care, Other (non HMO) | Source: Ambulatory Visit | Attending: Otolaryngology | Admitting: Otolaryngology

## 2017-04-30 DIAGNOSIS — J301 Allergic rhinitis due to pollen: Secondary | ICD-10-CM | POA: Diagnosis not present

## 2017-04-30 DIAGNOSIS — J342 Deviated nasal septum: Secondary | ICD-10-CM | POA: Insufficient documentation

## 2017-04-30 DIAGNOSIS — F419 Anxiety disorder, unspecified: Secondary | ICD-10-CM | POA: Insufficient documentation

## 2017-04-30 DIAGNOSIS — Z87891 Personal history of nicotine dependence: Secondary | ICD-10-CM | POA: Insufficient documentation

## 2017-04-30 DIAGNOSIS — J343 Hypertrophy of nasal turbinates: Secondary | ICD-10-CM | POA: Diagnosis not present

## 2017-04-30 DIAGNOSIS — F329 Major depressive disorder, single episode, unspecified: Secondary | ICD-10-CM | POA: Diagnosis not present

## 2017-04-30 DIAGNOSIS — G43909 Migraine, unspecified, not intractable, without status migrainosus: Secondary | ICD-10-CM | POA: Diagnosis not present

## 2017-04-30 HISTORY — PX: SEPTOPLASTY: SHX2393

## 2017-04-30 HISTORY — DX: Migraine, unspecified, not intractable, without status migrainosus: G43.909

## 2017-04-30 HISTORY — PX: TURBINATE REDUCTION: SHX6157

## 2017-04-30 SURGERY — SEPTOPLASTY, NOSE
Anesthesia: General

## 2017-04-30 MED ORDER — LIDOCAINE HCL (CARDIAC) 20 MG/ML IV SOLN
INTRAVENOUS | Status: DC | PRN
  Administered 2017-04-30: 50 mg via INTRAVENOUS

## 2017-04-30 MED ORDER — LACTATED RINGERS IV SOLN
INTRAVENOUS | Status: DC
  Administered 2017-04-30: 08:00:00 via INTRAVENOUS

## 2017-04-30 MED ORDER — SUCCINYLCHOLINE CHLORIDE 20 MG/ML IJ SOLN
INTRAMUSCULAR | Status: DC | PRN
  Administered 2017-04-30: 80 mg via INTRAVENOUS

## 2017-04-30 MED ORDER — OXYMETAZOLINE HCL 0.05 % NA SOLN
2.0000 | Freq: Once | NASAL | Status: AC
  Administered 2017-04-30: 2 via NASAL

## 2017-04-30 MED ORDER — FENTANYL CITRATE (PF) 100 MCG/2ML IJ SOLN: 25.0000 ug | INTRAMUSCULAR | Status: DC | PRN

## 2017-04-30 MED ORDER — DEXAMETHASONE SODIUM PHOSPHATE 4 MG/ML IJ SOLN
INTRAMUSCULAR | Status: DC | PRN
  Administered 2017-04-30: 8 mg via INTRAVENOUS

## 2017-04-30 MED ORDER — FENTANYL CITRATE (PF) 100 MCG/2ML IJ SOLN
INTRAMUSCULAR | Status: DC | PRN
  Administered 2017-04-30: 100 ug via INTRAVENOUS

## 2017-04-30 MED ORDER — OXYCODONE HCL 5 MG/5ML PO SOLN: 5.0000 mg | Freq: Once | ORAL | Status: DC | PRN

## 2017-04-30 MED ORDER — ONDANSETRON HCL 4 MG/2ML IJ SOLN
INTRAMUSCULAR | Status: DC | PRN
  Administered 2017-04-30: 4 mg via INTRAVENOUS

## 2017-04-30 MED ORDER — ACETAMINOPHEN 10 MG/ML IV SOLN
INTRAVENOUS | Status: DC | PRN
  Administered 2017-04-30: 1000 mg via INTRAVENOUS

## 2017-04-30 MED ORDER — LIDOCAINE-EPINEPHRINE 1 %-1:100000 IJ SOLN
INTRAMUSCULAR | Status: DC | PRN
  Administered 2017-04-30: 6 mL

## 2017-04-30 MED ORDER — OXYCODONE HCL 5 MG PO TABS: 5.0000 mg | ORAL_TABLET | Freq: Once | ORAL | Status: DC | PRN

## 2017-04-30 MED ORDER — ONDANSETRON HCL 4 MG/2ML IJ SOLN: 4.0000 mg | Freq: Once | INTRAMUSCULAR | Status: DC | PRN

## 2017-04-30 MED ORDER — PROPOFOL 10 MG/ML IV BOLUS
INTRAVENOUS | Status: DC | PRN
  Administered 2017-04-30: 150 mg via INTRAVENOUS

## 2017-04-30 MED ORDER — CEFAZOLIN SODIUM-DEXTROSE 2-4 GM/100ML-% IV SOLN
2.0000 g | Freq: Once | INTRAVENOUS | Status: AC
  Administered 2017-04-30: 2 g via INTRAVENOUS

## 2017-04-30 MED ORDER — LIDOCAINE HCL 1 % IJ SOLN
INTRAMUSCULAR | Status: DC | PRN
  Administered 2017-04-30: 15 mL via TOPICAL

## 2017-04-30 SURGICAL SUPPLY — 21 items
CANISTER SUCT 1200ML W/VALVE (MISCELLANEOUS) ×4 IMPLANT
COAGULATOR SUCT 8FR VV (MISCELLANEOUS) ×4 IMPLANT
DRAPE HEAD BAR (DRAPES) ×4 IMPLANT
GLOVE PI ULTRA LF STRL 7.5 (GLOVE) ×4 IMPLANT
GLOVE PI ULTRA NON LATEX 7.5 (GLOVE) ×4
KIT ROOM TURNOVER OR (KITS) ×4 IMPLANT
NEEDLE ANESTHESIA  27G X 3.5 (NEEDLE)
NEEDLE ANESTHESIA 27G X 3.5 (NEEDLE) IMPLANT
PACK DRAPE NASAL/ENT (PACKS) ×4 IMPLANT
PAD GROUND ADULT SPLIT (MISCELLANEOUS) ×4 IMPLANT
PATTIES SURGICAL .5 X3 (DISPOSABLE) ×4 IMPLANT
SPLINT NASAL SEPTAL BLV .50 ST (MISCELLANEOUS) ×4 IMPLANT
STRAP BODY AND KNEE 60X3 (MISCELLANEOUS) ×4 IMPLANT
SUT CHROMIC 3-0 (SUTURE) ×2
SUT CHROMIC 3-0 KS 27XMFL CR (SUTURE) ×2
SUT ETHILON 3-0 KS 30 BLK (SUTURE) ×4 IMPLANT
SUT PLAIN GUT 4-0 (SUTURE) ×4 IMPLANT
SUTURE CHRMC 3-0 KS 27XMFL CR (SUTURE) ×2 IMPLANT
SYR 3ML LL SCALE MARK (SYRINGE) ×4 IMPLANT
TOWEL OR 17X26 4PK STRL BLUE (TOWEL DISPOSABLE) ×4 IMPLANT
WATER STERILE IRR 250ML POUR (IV SOLUTION) ×4 IMPLANT

## 2017-04-30 NOTE — Anesthesia Procedure Notes (Signed)
Procedure Name: Intubation Date/Time: 04/30/2017 8:39 AM Performed by: Londell Moh Pre-anesthesia Checklist: Patient identified, Emergency Drugs available, Suction available, Patient being monitored and Timeout performed Patient Re-evaluated:Patient Re-evaluated prior to induction Oxygen Delivery Method: Circle system utilized Preoxygenation: Pre-oxygenation with 100% oxygen Induction Type: IV induction Ventilation: Mask ventilation without difficulty Laryngoscope Size: Mac and 3 Grade View: Grade I Tube type: Oral Rae Tube size: 7.0 mm Number of attempts: 1 Placement Confirmation: ETT inserted through vocal cords under direct vision,  positive ETCO2 and breath sounds checked- equal and bilateral Tube secured with: Tape Dental Injury: Teeth and Oropharynx as per pre-operative assessment

## 2017-04-30 NOTE — Transfer of Care (Signed)
Immediate Anesthesia Transfer of Care Note  Patient: Justin Ellis  Procedure(s) Performed: SEPTOPLASTY (N/A ) BILATERAL INFERIOR TURBINATE REDUCTION (Bilateral )  Patient Location: PACU  Anesthesia Type: General ETT  Level of Consciousness: awake, alert  and patient cooperative  Airway and Oxygen Therapy: Patient Spontanous Breathing and Patient connected to supplemental oxygen  Post-op Assessment: Post-op Vital signs reviewed, Patient's Cardiovascular Status Stable, Respiratory Function Stable, Patent Airway and No signs of Nausea or vomiting  Post-op Vital Signs: Reviewed and stable  Complications: No apparent anesthesia complications

## 2017-04-30 NOTE — Anesthesia Postprocedure Evaluation (Signed)
Anesthesia Post Note  Patient: Justin Ellis  Procedure(s) Performed: SEPTOPLASTY (N/A ) BILATERAL INFERIOR TURBINATE REDUCTION (Bilateral )  Patient location during evaluation: PACU Anesthesia Type: General Level of consciousness: awake and alert Pain management: pain level controlled Vital Signs Assessment: post-procedure vital signs reviewed and stable Respiratory status: spontaneous breathing Cardiovascular status: blood pressure returned to baseline Postop Assessment: no headache Anesthetic complications: no    Verner Chol, III,  Carter Kassel D

## 2017-04-30 NOTE — Op Note (Signed)
04/30/2017  9:31 AM  [id number]   Pre-Op Dx:  Deviated Nasal Septum, Hypertrophic Inferior Turbinates  Post-op Dx: Same  Proc: Nasal Septoplasty, Bilateral Partial Reduction Inferior Turbinates   Surg:  Tilmon Wisehart H  Anes:  GOT  EBL:  50 ml  Comp:  None  Findings: Septum bowed right side with inferior spur on the left. Enlarged inferior turbinates.  Procedure: With the patient in a comfortable supine position,  general orotracheal anesthesia was induced without difficulty.     The patient received preoperative Afrin spray for topical decongestion and vasoconstriction.  Intravenous prophylactic antibiotics were administered.  At an appropriate level, the patient was placed in a semi-sitting position.  Nasal vibrissae were trimmed.   1% Xylocaine with 1:100,000 epinephrine, 6 cc's, was infiltrated into the anterior floor of the nose, into the nasal spine region, into the membranous columella, and finally into the submucoperichondrial plane of the septum on both sides.  Several minutes were allowed for this to take effect.  Cottoniod pledgetts soaked in Afrin and 4% Xylocaine were placed into both nasal cavities and left while the patient was prepped and draped in the standard fashion.  The materials were removed from the nose and observed to be intact and correct in number.  The nose was inspected with a headlight and the 0 scope with the findings as described above.  A left Killian incision was sharply executed and carried down to the quadrangular cartilage. The mucoperichondrium was elelvated along the quadrangular plate back to the bony-cartilaginous junction. The mucoperiostium was then elevated along the ethmoid plate and the vomer. The boney-catilaginous junction was then split with a freer elevator and the mucoperiosteum was elevated on the opposite side. The mucoperiosteum was then elevated along the maxillary crest as needed to expose the crooked bone of the crest.  Boney spurs  of the vomer and maxillary crest were removed with Lenoria Chime forceps.  The cartilaginous plate was trimmed along its posterior and inferior borders of about 2 mm of cartilage to free it up inferiorly. Some of the deviated ethmoid plate was then fractured and removed with Takahashi forceps to free up the posterior border of the quadrangular plate and allow it to swing back to the midline. The mucosal flaps were placed back into their anatomic position to allow visualization of the airways. The septum now sat in the midline with an improved airway.  A 3-0 Chromic suture on a Keith needle in used to anchor the inferior septum at the nasal spine with a through and through suture. The mucosal flaps are then sutured together using a through and through whip stitch of 4-0 Plain Gut with a mini-Keith needle. This was used to close the [place] incision as well.   The inferior turbinates were then inspected. An incision was created along the inferior aspect of the left inferior turbinate with removal of some of the inferior soft tissue. Electrocautery was used to control bleeding in the area. The remaining turbinate was then outfractured to open up the airway further. There was no significant bleeding noted. The right turbinate was then trimmed and outfractured in a similar fashion.  The airways were then visualized and showed open passageways on both sides that were significantly improved compared to before surgery. There was no signifcant bleeding. Nasal splints were applied to both sides of the septum using Xomed 0.56mm regular sized splints that were trimmed, and then held in position with a 3-0 Nylon through and through suture.  The patient was turned back  over to anesthesia, and awakened, extubated, and taken to the PACU in satisfactory condition.  Dispo:   PACU to home  Plan: Ice, elevation, narcotic analgesia, steroid taper, and prophylactic antibiotics for the duration of indwelling nasal foreign bodies.   We will reevaluate the patient in the office in 6 days and remove the septal splints.  Return to work in 10 days, strenuous activities in two weeks.   Margeart Allender H 04/30/2017 9:31 AM

## 2017-04-30 NOTE — Anesthesia Preprocedure Evaluation (Signed)
Anesthesia Evaluation  Patient identified by MRN, date of birth, ID band Patient awake    Reviewed: Allergy & Precautions, H&P , NPO status , Patient's Chart, lab work & pertinent test results  Airway Mallampati: II  TM Distance: >3 FB Neck ROM: full    Dental no notable dental hx.    Pulmonary former smoker,    Pulmonary exam normal breath sounds clear to auscultation       Cardiovascular negative cardio ROS Normal cardiovascular exam     Neuro/Psych    GI/Hepatic negative GI ROS, Neg liver ROS,   Endo/Other  negative endocrine ROS  Renal/GU negative Renal ROS  negative genitourinary   Musculoskeletal   Abdominal   Peds  Hematology negative hematology ROS (+)   Anesthesia Other Findings   Reproductive/Obstetrics                            Anesthesia Physical Anesthesia Plan  ASA: II  Anesthesia Plan: General ETT   Post-op Pain Management:    Induction:   PONV Risk Score and Plan:   Airway Management Planned:   Additional Equipment:   Intra-op Plan:   Post-operative Plan:   Informed Consent: I have reviewed the patients History and Physical, chart, labs and discussed the procedure including the risks, benefits and alternatives for the proposed anesthesia with the patient or authorized representative who has indicated his/her understanding and acceptance.     Plan Discussed with:   Anesthesia Plan Comments:         Anesthesia Quick Evaluation

## 2017-04-30 NOTE — H&P (Signed)
H&P has been reviewedand patient reevaluated,  and no changes necessary. To be downloaded later.  

## 2017-05-01 ENCOUNTER — Encounter: Payer: Self-pay | Admitting: Otolaryngology

## 2017-05-19 ENCOUNTER — Ambulatory Visit: Payer: Managed Care, Other (non HMO) | Admitting: Allergy & Immunology

## 2017-12-14 ENCOUNTER — Encounter (HOSPITAL_COMMUNITY): Payer: Self-pay | Admitting: Emergency Medicine

## 2017-12-14 ENCOUNTER — Other Ambulatory Visit: Payer: Self-pay

## 2017-12-14 ENCOUNTER — Emergency Department (HOSPITAL_COMMUNITY): Payer: Managed Care, Other (non HMO)

## 2017-12-14 ENCOUNTER — Emergency Department (HOSPITAL_COMMUNITY)
Admission: EM | Admit: 2017-12-14 | Discharge: 2017-12-14 | Disposition: A | Discharge status: Home / Self Care | Payer: Managed Care, Other (non HMO) | Attending: Emergency Medicine | Admitting: Emergency Medicine

## 2017-12-14 DIAGNOSIS — Z87891 Personal history of nicotine dependence: Secondary | ICD-10-CM | POA: Insufficient documentation

## 2017-12-14 DIAGNOSIS — R112 Nausea with vomiting, unspecified: Secondary | ICD-10-CM

## 2017-12-14 DIAGNOSIS — Z79899 Other long term (current) drug therapy: Secondary | ICD-10-CM | POA: Insufficient documentation

## 2017-12-14 DIAGNOSIS — J111 Influenza due to unidentified influenza virus with other respiratory manifestations: Secondary | ICD-10-CM | POA: Insufficient documentation

## 2017-12-14 LAB — BASIC METABOLIC PANEL
Anion gap: 11 (ref 5–15)
BUN: 12 mg/dL (ref 6–20)
CALCIUM: 9.5 mg/dL (ref 8.9–10.3)
CO2: 26 mmol/L (ref 22–32)
CREATININE: 0.97 mg/dL (ref 0.61–1.24)
Chloride: 99 mmol/L — ABNORMAL LOW (ref 101–111)
Glucose, Bld: 114 mg/dL — ABNORMAL HIGH (ref 65–99)
Potassium: 4 mmol/L (ref 3.5–5.1)
Sodium: 136 mmol/L (ref 135–145)

## 2017-12-14 LAB — CBC WITH DIFFERENTIAL/PLATELET
BASOS ABS: 0 10*3/uL (ref 0.0–0.1)
Basophils Relative: 0 %
EOS PCT: 0 %
Eosinophils Absolute: 0 10*3/uL (ref 0.0–0.7)
HCT: 42.7 % (ref 39.0–52.0)
Hemoglobin: 14.1 g/dL (ref 13.0–17.0)
LYMPHS ABS: 0.9 10*3/uL (ref 0.7–4.0)
LYMPHS PCT: 7 %
MCH: 28.3 pg (ref 26.0–34.0)
MCHC: 33 g/dL (ref 30.0–36.0)
MCV: 85.7 fL (ref 78.0–100.0)
MONO ABS: 0.8 10*3/uL (ref 0.1–1.0)
MONOS PCT: 6 %
NEUTROS ABS: 12.2 10*3/uL — AB (ref 1.7–7.7)
Neutrophils Relative %: 87 %
Platelets: 274 10*3/uL (ref 150–400)
RBC: 4.98 MIL/uL (ref 4.22–5.81)
RDW: 12.4 % (ref 11.5–15.5)
WBC: 14 10*3/uL — ABNORMAL HIGH (ref 4.0–10.5)

## 2017-12-14 MED ORDER — PREDNISOLONE SODIUM PHOSPHATE 25 MG/5ML PO SOLN: 10.0000 mL | Freq: Every day | ORAL | 0 refills | Status: DC

## 2017-12-14 MED ORDER — ONDANSETRON HCL 4 MG PO TABS: 4.0000 mg | ORAL_TABLET | Freq: Four times a day (QID) | ORAL | 0 refills | Status: DC

## 2017-12-14 MED ORDER — ONDANSETRON HCL 4 MG/2ML IJ SOLN
4.0000 mg | Freq: Once | INTRAMUSCULAR | Status: AC
  Administered 2017-12-14: 4 mg via INTRAVENOUS
  Filled 2017-12-14: qty 2

## 2017-12-14 MED ORDER — SODIUM CHLORIDE 0.9 % IV BOLUS
1000.0000 mL | Freq: Once | INTRAVENOUS | Status: AC
  Administered 2017-12-14: 1000 mL via INTRAVENOUS

## 2017-12-14 NOTE — ED Triage Notes (Signed)
Pt seen at urgent care and tested positive for flu. Tried going home and drinking fluids but has had n/v x 2 and unable to hold fluids down. Sorethroat, n/v/headache

## 2017-12-14 NOTE — ED Provider Notes (Signed)
[facility] EMERGENCY DEPARTMENT Provider Note   CSN: [id number] Arrival date & time: 12/14/17  1810     History   Chief Complaint Chief Complaint  Patient presents with  . Emesis    FLU POSITIVE    HPI Justin Ellis is a 32 y.o. male.  Patient is a 32 year old male who presents to the emergency department with a complaint of flu and vomiting.  Patient states that he has been having problems with nausea and vomiting for the past several hours.  He was seen earlier today at urgent care and tested positive for flu.  He went home, attempted to drink fluids as he was instructed, but could not keep anything down.  He was advised to come to the emergency department.  He tried to handle this at home but could not do so then he came to the emergency department.    The history is provided by the patient.  Emesis   Associated symptoms include myalgias. Pertinent negatives include no abdominal pain, no arthralgias and no cough.    Past Medical History:  Diagnosis Date  . Migraine headache    2-3x/wk  . Vasovagal near syncope 03/10/2012    Patient Active Problem List   Diagnosis Date Noted  . Adverse food reaction 03/03/2017  . Non-seasonal allergic rhinitis due to fungal spores 03/03/2017  . SOB (shortness of breath) 03/03/2017  . Vasovagal near syncope 03/10/2012    Past Surgical History:  Procedure Laterality Date  . NO PAST SURGERIES    . SEPTOPLASTY N/A 04/30/2017   Procedure: SEPTOPLASTY;  Surgeon: Vernie Murders, MD;  Location: [facility];  Service: ENT;  Laterality: N/A;  GAVE DISK TO [facility] 9-25-KP  . TURBINATE REDUCTION Bilateral 04/30/2017   Procedure: BILATERAL INFERIOR TURBINATE REDUCTION;  Surgeon: Vernie Murders, MD;  Location: [facility];  Service: ENT;  Laterality: Bilateral;        Home Medications    Prior to Admission medications   Medication Sig Start Date End Date Taking? Authorizing Provider  fluticasone (FLONASE) 50 MCG/ACT nasal  spray Place 2 sprays into both nostrils daily. 03/03/17   Alfonse Spruce, MD  Multiple Vitamin (MULTIVITAMIN) tablet Take 1 tablet by mouth daily.    [provider]  prednisoLONE Sodium Phosphate 25 MG/5ML SOLN Take 10 mLs by mouth daily. 12/14/17   Ivery Quale, PA-C  SUMAtriptan (IMITREX) 100 MG tablet Take 100 mg by mouth every 2 (two) hours as needed for migraine. May repeat in 2 hours if headache persists or recurs.    [provider]    Family History Family History  Problem Relation Age of Onset  . Allergic rhinitis Sister   . Allergic rhinitis Brother   . Allergic rhinitis Maternal Aunt   . Allergic rhinitis Maternal Uncle   . Allergic rhinitis Brother   . Allergic rhinitis Brother   . Allergic rhinitis Brother   . Allergic rhinitis Brother   . Angioedema Neg Hx   . Asthma Neg Hx   . Atopy Neg Hx   . Eczema Neg Hx   . Immunodeficiency Neg Hx   . Urticaria Neg Hx     Social History Social History   Tobacco Use  . Smoking status: Former Smoker    Packs/day: 1.00    Years: 3.00    Pack years: 3.00    Types: Cigarettes    Last attempt to quit: 2016    Years since quitting: 3.3  . Smokeless tobacco: Never Used  Substance Use Topics  . Alcohol use: No    Alcohol/week: 0.0 oz  . Drug use: Never     Allergies   Patient has no known allergies.   Review of Systems Review of Systems  Constitutional: Positive for activity change and appetite change.       All ROS Neg except as noted in HPI  HENT: Positive for congestion. Negative for nosebleeds.   Eyes: Negative for photophobia and discharge.  Respiratory: Negative for cough, shortness of breath and wheezing.   Cardiovascular: Negative for chest pain and palpitations.  Gastrointestinal: Positive for nausea and vomiting. Negative for abdominal pain and blood in stool.  Genitourinary: Negative for dysuria, frequency and hematuria.  Musculoskeletal: Positive for myalgias. Negative for  arthralgias, back pain and neck pain.  Skin: Negative.   Neurological: Negative for dizziness, seizures and speech difficulty.  Psychiatric/Behavioral: Negative for confusion and hallucinations.     Physical Exam Updated Vital Signs BP 113/82   Pulse (!) 105   Temp 98.1 F (36.7 C) (Oral)   Resp 17   Ht  (1.676 m)   Wt 63.5 kg (140 lb)   SpO2 100%   BMI 22.60 kg/m   Physical Exam  Constitutional: He is oriented to person, place, and time. He appears well-developed and well-nourished.  Non-toxic appearance.  HENT:  Head: Normocephalic.  Right Ear: Tympanic membrane and external ear normal.  Left Ear: Tympanic membrane and external ear normal.  Eyes: Pupils are equal, round, and reactive to light. EOM and lids are normal.  Neck: Normal range of motion. Neck supple. Carotid bruit is not present.  Cardiovascular: Normal rate, regular rhythm, normal heart sounds, intact distal pulses and normal pulses.  Pulmonary/Chest: Breath sounds normal. No respiratory distress.  Abdominal: Soft. Bowel sounds are normal. There is no tenderness. There is no guarding.  Musculoskeletal: Normal range of motion.  Lymphadenopathy:       Head (right side): No submandibular adenopathy present.       Head (left side): No submandibular adenopathy present.    He has no cervical adenopathy.  Neurological: He is alert and oriented to person, place, and time. He has normal strength. No cranial nerve deficit or sensory deficit.  Skin: Skin is warm and dry.  Psychiatric: He has a normal mood and affect. His speech is normal.  Nursing note and vitals reviewed.    ED Treatments / Results  Labs (all labs ordered are listed, but only abnormal results are displayed) Labs Reviewed  CBC WITH DIFFERENTIAL/PLATELET - Abnormal; Notable for the following components:      Result Value   WBC 14.0 (*)    Neutro Abs 12.2 (*)    All other components within normal limits  BASIC METABOLIC PANEL - Abnormal;  Notable for the following components:   Chloride 99 (*)    Glucose, Bld 114 (*)    All other components within normal limits    EKG None  Radiology No results found.  Procedures Procedures (including critical care time)  Medications Ordered in ED Medications - No data to display   Initial Impression / Assessment and Plan / ED Course  I have reviewed the triage vital signs and the nursing notes.  Pertinent labs & imaging results that were available during my care of the patient were reviewed by me and considered in my medical decision making (see chart for details).       Final Clinical Impressions(s) / ED Diagnoses MDM  Vital signs within normal  limits.  Pulse oximetry is 100% on room air.  Within normal limits by my interpretation.  Patient was diagnosed with influenza.  He has been having problems with nausea/vomiting during a large portion of the day today.  He was advised to come to the emergency department for fluids and medications.  Patient treated in the emergency department with IV fluids and antiemetics.  Patient feeling much better after fluids and antiemetics.  Patient will be discharged home with prescription of Zofran.  Patient is given precautions on washing hands frequently and good hydration.  We discussed the contagious nature of the flu.  Patient acknowledges understanding of the instructions.   Final diagnoses:  Influenza  Non-intractable vomiting with nausea, unspecified vomiting type    ED Discharge Orders        Ordered    prednisoLONE Sodium Phosphate 25 MG/5ML SOLN  Daily     12/14/17 1926    ondansetron (ZOFRAN) 4 MG tablet  Every 6 hours     12/14/17 2151       Ivery Quale, PA-C 12/14/17 2200    Loren Racer, MD 12/18/17 1427

## 2017-12-14 NOTE — Discharge Instructions (Addendum)
Your vital signs are within normal limits.  You received a diagnosis earlier of influenza.  We treated you in the emergency department with IV fluids and anti-medics (medicines to help control nausea and vomiting).  Please use clear liquids over the next 24 hours.  Gradually work your way back to your regular diet.  Influenza is highly contagious.  Please use a mask.  Please wash hands frequently.  Do not allow anyone to share your eating utensils.  See your primary physician, or return to the emergency department if any changes or problems.

## 2019-03-24 ENCOUNTER — Other Ambulatory Visit: Payer: Self-pay

## 2019-03-24 DIAGNOSIS — Z20822 Contact with and (suspected) exposure to covid-19: Secondary | ICD-10-CM

## 2019-03-26 LAB — NOVEL CORONAVIRUS, NAA: SARS-CoV-2, NAA: NOT DETECTED

## 2019-11-23 ENCOUNTER — Encounter: Payer: Self-pay | Admitting: Pulmonary Disease

## 2019-11-23 ENCOUNTER — Other Ambulatory Visit: Payer: Self-pay

## 2019-11-23 ENCOUNTER — Ambulatory Visit (INDEPENDENT_AMBULATORY_CARE_PROVIDER_SITE_OTHER): Payer: Managed Care, Other (non HMO) | Admitting: Pulmonary Disease

## 2019-11-23 VITALS — BP 102/78 | HR 83 | Temp 97.6°F | Ht 60.6 in | Wt 173.0 lb

## 2019-11-23 DIAGNOSIS — G4733 Obstructive sleep apnea (adult) (pediatric): Secondary | ICD-10-CM

## 2019-11-23 NOTE — Progress Notes (Signed)
Justin Ellis    [id number]    Sep 18, 1985  Primary Care Physician:Patient, No Pcp Per  Referring Physician: No referring provider defined for this encounter.  Chief complaint:   Snoring  HPI:  History of snoring witnessed apneas Concern for obstructive sleep apnea Has been told by spouse and mother about snoring Buddies of his that he was away with about a month ago did notice apneas  Admits to dryness of his mouth in the mornings Headaches in the mornings No night sweats Memory is fair Concentration is a little bit off Focus is a little bit off as well  Has had snoring for many years  Reformed smoker  Outpatient Encounter Medications as of 11/23/2019  Medication Sig  . buPROPion (WELLBUTRIN XL) 300 MG 24 hr tablet Take 300 mg by mouth every morning.  . escitalopram (LEXAPRO) 20 MG tablet Take 20 mg by mouth every morning.  . [DISCONTINUED] ondansetron (ZOFRAN) 4 MG tablet Take 1 tablet (4 mg total) by mouth every 6 (six) hours.  . [DISCONTINUED] fluticasone (FLONASE) 50 MCG/ACT nasal spray Place 2 sprays into both nostrils daily.  . [DISCONTINUED] Multiple Vitamin (MULTIVITAMIN) tablet Take 1 tablet by mouth daily.  . [DISCONTINUED] SUMAtriptan (IMITREX) 100 MG tablet Take 100 mg by mouth every 2 (two) hours as needed for migraine. May repeat in 2 hours if headache persists or recurs.   No facility-administered encounter medications on file as of 11/23/2019.    Allergies as of 11/23/2019  . (No Known Allergies)    Past Medical History:  Diagnosis Date  . Migraine headache    2-3x/wk  . Vasovagal near syncope 03/10/2012    Past Surgical History:  Procedure Laterality Date  . NO PAST SURGERIES    . SEPTOPLASTY N/A 04/30/2017   Procedure: SEPTOPLASTY;  Surgeon: Vernie Murders, MD;  Location: [facility];  Service: ENT;  Laterality: N/A;  GAVE DISK TO [facility] 9-25-KP  . TURBINATE REDUCTION Bilateral 04/30/2017   Procedure: BILATERAL INFERIOR  TURBINATE REDUCTION;  Surgeon: Vernie Murders, MD;  Location: [facility];  Service: ENT;  Laterality: Bilateral;    Family History  Problem Relation Age of Onset  . Allergic rhinitis Sister   . Allergic rhinitis Brother   . Allergic rhinitis Maternal Aunt   . Allergic rhinitis Maternal Uncle   . Allergic rhinitis Brother   . Allergic rhinitis Brother   . Allergic rhinitis Brother   . Allergic rhinitis Brother   . Angioedema Neg Hx   . Asthma Neg Hx   . Atopy Neg Hx   . Eczema Neg Hx   . Immunodeficiency Neg Hx   . Urticaria Neg Hx     Social History   Socioeconomic History  . Marital status: Married    Spouse name: Not on file  . Number of children: Not on file  . Years of education: Not on file  . Highest education level: Not on file  Occupational History  . Not on file  Tobacco Use  . Smoking status: Former Smoker    Packs/day: 1.00    Years: 3.00    Pack years: 3.00    Types: Cigarettes    Quit date: 2016    Years since quitting: 5.3  . Smokeless tobacco: Never Used  Substance and Sexual Activity  . Alcohol use: No    Alcohol/week: 0.0 standard drinks  . Drug use: Never  . Sexual activity: Not on file  Other Topics Concern  .  Not on file  Social History Narrative  . Not on file   Social Determinants of Health   Financial Resource Strain:   . Difficulty of Paying Living Expenses:   Food Insecurity:   . Worried About Charity fundraiser in the Last Year:   . Arboriculturist in the Last Year:   Transportation Needs:   . Film/video editor (Medical):   Marland Kitchen Lack of Transportation (Non-Medical):   Physical Activity:   . Days of Exercise per Week:   . Minutes of Exercise per Session:   Stress:   . Feeling of Stress :   Social Connections:   . Frequency of Communication with Friends and Family:   . Frequency of Social Gatherings with Friends and Family:   . Attends Religious Services:   . Active Member of Clubs or Organizations:   . Attends  Archivist Meetings:   Marland Kitchen Marital Status:   Intimate Partner Violence:   . Fear of Current or Ex-Partner:   . Emotionally Abused:   Marland Kitchen Physically Abused:   . Sexually Abused:     Review of Systems  HENT: Negative.   Eyes: Negative.   Respiratory: Negative.   Cardiovascular: Negative.   Gastrointestinal: Negative.   Psychiatric/Behavioral: Positive for sleep disturbance.   Vitals:   11/23/19 1553  BP: 102/78  Pulse: 83  Temp: 97.6 F (36.4 C)  SpO2: 98%   Physical Exam  Constitutional: He is oriented to person, place, and time. He appears well-developed and well-nourished.  HENT:  Head: Normocephalic and atraumatic.  Eyes: Pupils are equal, round, and reactive to light. Right eye exhibits no discharge. Left eye exhibits no discharge.  Neck: No tracheal deviation present. No thyromegaly present.  Cardiovascular: Normal rate and regular rhythm.  Pulmonary/Chest: Effort normal and breath sounds normal. No respiratory distress. He has no wheezes. He has no rales. He exhibits no tenderness.  Musculoskeletal:        General: No deformity or edema. Normal range of motion.     Cervical back: Normal range of motion and neck supple.  Neurological: He is alert and oriented to person, place, and time. No cranial nerve deficit. Coordination normal.  Skin: Skin is warm and dry.  Psychiatric: He has a normal mood and affect.   Results of the Epworth flowsheet 11/23/2019  Sitting and reading 2  Watching TV 3  Sitting, inactive in a public place (e.g. a theatre or a meeting) 0  As a passenger in a car for an hour without a break 1  Lying down to rest in the afternoon when circumstances permit 3  Sitting and talking to someone 1  Sitting quietly after a lunch without alcohol 1  In a car, while stopped for a few minutes in traffic 0  Total score 11    Assessment:  Moderate probability of significant obstructive sleep apnea  Obesity  Excessive daytime sleepiness   Pathophysiology of sleep disordered breathing discussed with the patient Treatment options for sleep disordered breathing discussed with the patient  Plan/Recommendations: Schedule the patient for home sleep study  Weight loss efforts encouraged  Treatment options will include CPAP, weight loss and exercise  Follow-up in 3 months   Sherrilyn Rist MD  Pulmonary and Critical Care 11/23/2019, 4:14 PM  CC: No ref. provider found

## 2019-11-23 NOTE — Patient Instructions (Signed)
Moderate probability of significant obstructive sleep apnea  We will schedule you for home sleep study Update your results as they become available  Starting treatment with CPAP or other forms of treatment as discussed  We will see you back in the office in about 2 to 3 months  Call with significant concerns Sleep Apnea Sleep apnea is a condition in which breathing pauses or becomes shallow during sleep. Episodes of sleep apnea usually last 10 seconds or longer, and they may occur as many as 20 times an hour. Sleep apnea disrupts your sleep and keeps your body from getting the rest that it needs. This condition can increase your risk of certain health problems, including:  Heart attack.  Stroke.  Obesity.  Diabetes.  Heart failure.  Irregular heartbeat. What are the causes? There are three kinds of sleep apnea:  Obstructive sleep apnea. This kind is caused by a blocked or collapsed airway.  Central sleep apnea. This kind happens when the part of the brain that controls breathing does not send the correct signals to the muscles that control breathing.  Mixed sleep apnea. This is a combination of obstructive and central sleep apnea. The most common cause of this condition is a collapsed or blocked airway. An airway can collapse or become blocked if:  Your throat muscles are abnormally relaxed.  Your tongue and tonsils are larger than normal.  You are overweight.  Your airway is smaller than normal. What increases the risk? You are more likely to develop this condition if you:  Are overweight.  Smoke.  Have a smaller than normal airway.  Are elderly.  Are male.  Drink alcohol.  Take sedatives or tranquilizers.  Have a family history of sleep apnea. What are the signs or symptoms? Symptoms of this condition include:  Trouble staying asleep.  Daytime sleepiness and tiredness.  Irritability.  Loud snoring.  Morning headaches.  Trouble concentrating.   Forgetfulness.  Decreased interest in sex.  Unexplained sleepiness.  Mood swings.  Personality changes.  Feelings of depression.  Waking up often during the night to urinate.  Dry mouth.  Sore throat. How is this diagnosed? This condition may be diagnosed with:  A medical history.  A physical exam.  A series of tests that are done while you are sleeping (sleep study). These tests are usually done in a sleep lab, but they may also be done at home. How is this treated? Treatment for this condition aims to restore normal breathing and to ease symptoms during sleep. It may involve managing health issues that can affect breathing, such as high blood pressure or obesity. Treatment may include:  Sleeping on your side.  Using a decongestant if you have nasal congestion.  Avoiding the use of depressants, including alcohol, sedatives, and narcotics.  Losing weight if you are overweight.  Making changes to your diet.  Quitting smoking.  Using a device to open your airway while you sleep, such as: ? An oral appliance. This is a custom-made mouthpiece that shifts your lower jaw forward. ? A continuous positive airway pressure (CPAP) device. This device blows air through a mask when you breathe out (exhale). ? A nasal expiratory positive airway pressure (EPAP) device. This device has valves that you put into each nostril. ? A bi-level positive airway pressure (BPAP) device. This device blows air through a mask when you breathe in (inhale) and breathe out (exhale).  Having surgery if other treatments do not work. During surgery, excess tissue is removed to  create a wider airway. It is important to get treatment for sleep apnea. Without treatment, this condition can lead to:  High blood pressure.  Coronary artery disease.  In men, an inability to achieve or maintain an erection (impotence).  Reduced thinking abilities. Follow these instructions at home: Lifestyle  Make  any lifestyle changes that your health care provider recommends.  Eat a healthy, well-balanced diet.  Take steps to lose weight if you are overweight.  Avoid using depressants, including alcohol, sedatives, and narcotics.  Do not use any products that contain nicotine or tobacco, such as cigarettes, e-cigarettes, and chewing tobacco. If you need help quitting, ask your health care provider. General instructions  Take over-the-counter and prescription medicines only as told by your health care provider.  If you were given a device to open your airway while you sleep, use it only as told by your health care provider.  If you are having surgery, make sure to tell your health care provider you have sleep apnea. You may need to bring your device with you.  Keep all follow-up visits as told by your health care provider. This is important. Contact a health care provider if:  The device that you received to open your airway during sleep is uncomfortable or does not seem to be working.  Your symptoms do not improve.  Your symptoms get worse. Get help right away if:  You develop: ? Chest pain. ? Shortness of breath. ? Discomfort in your back, arms, or stomach.  You have: ? Trouble speaking. ? Weakness on one side of your body. ? Drooping in your face. These symptoms may represent a serious problem that is an emergency. Do not wait to see if the symptoms will go away. Get medical help right away. Call your local emergency services (911 in the [place] Do not drive yourself to the hospital. Summary  Sleep apnea is a condition in which breathing pauses or becomes shallow during sleep.  The most common cause is a collapsed or blocked airway.  The goal of treatment is to restore normal breathing and to ease symptoms during sleep. This information is not intended to replace advice given to you by your health care provider. Make sure you discuss any questions you have with your health care  provider. Document Revised: 12/29/2018 Document Reviewed: 03/09/2018 Elsevier Patient Education  2020 [employer].

## 2020-01-03 ENCOUNTER — Other Ambulatory Visit: Payer: Self-pay

## 2020-01-03 ENCOUNTER — Ambulatory Visit: Payer: Managed Care, Other (non HMO)

## 2020-01-03 DIAGNOSIS — G4733 Obstructive sleep apnea (adult) (pediatric): Secondary | ICD-10-CM

## 2020-01-04 ENCOUNTER — Other Ambulatory Visit: Payer: Self-pay

## 2020-01-04 ENCOUNTER — Ambulatory Visit: Payer: Managed Care, Other (non HMO) | Admitting: Pulmonary Disease

## 2020-01-05 DIAGNOSIS — G4733 Obstructive sleep apnea (adult) (pediatric): Secondary | ICD-10-CM | POA: Diagnosis not present

## 2020-01-09 ENCOUNTER — Telehealth: Payer: Self-pay | Admitting: Pulmonary Disease

## 2020-01-09 DIAGNOSIS — G4733 Obstructive sleep apnea (adult) (pediatric): Secondary | ICD-10-CM

## 2020-01-09 NOTE — Telephone Encounter (Signed)
Patient contacted with results of home sleep study. Patient agrees to start CPAP therapy for mild obstructive sleep apnea. Orders placed to DME and recall in for follow up appointment with Dr. Wynona Neat. Patient verbalized understanding of results and plan of care including compliance.

## 2020-01-09 NOTE — Telephone Encounter (Signed)
Call patient  Sleep study result  Date of study: 01/03/2020  Impression: Mild obstructive sleep apnea with significant daytime sleepiness  Recommendation: Recommend CPAP therapy for mild obstructive sleep apnea Auto titrating CPAP with pressure settings of 5-15 will be appropriate

## 2023-09-25 ENCOUNTER — Other Ambulatory Visit: Payer: Self-pay | Admitting: Physician Assistant

## 2023-09-25 DIAGNOSIS — Z8719 Personal history of other diseases of the digestive system: Secondary | ICD-10-CM

## 2023-10-02 ENCOUNTER — Ambulatory Visit
Admission: RE | Admit: 2023-10-02 | Discharge: 2023-10-02 | Disposition: A | Discharge status: Home / Self Care | Payer: Managed Care, Other (non HMO) | Source: Ambulatory Visit | Attending: Physician Assistant | Admitting: Physician Assistant

## 2023-10-02 DIAGNOSIS — Z8719 Personal history of other diseases of the digestive system: Secondary | ICD-10-CM

## 2023-10-08 ENCOUNTER — Ambulatory Visit
Admission: RE | Admit: 2023-10-08 | Discharge: 2023-10-08 | Disposition: A | Discharge status: Home / Self Care | Source: Ambulatory Visit | Attending: Physician Assistant | Admitting: Physician Assistant

## 2023-10-08 ENCOUNTER — Ambulatory Visit: Attending: Neurosurgery | Admitting: Physical Therapy

## 2023-10-08 ENCOUNTER — Other Ambulatory Visit: Payer: Self-pay

## 2023-10-08 ENCOUNTER — Encounter: Payer: Self-pay | Admitting: Physical Therapy

## 2023-10-08 DIAGNOSIS — M62830 Muscle spasm of back: Secondary | ICD-10-CM | POA: Insufficient documentation

## 2023-10-08 DIAGNOSIS — M546 Pain in thoracic spine: Secondary | ICD-10-CM | POA: Insufficient documentation

## 2023-10-08 NOTE — Therapy (Signed)
 OUTPATIENT PHYSICAL THERAPY THORACOLUMBAR EVALUATION   Patient Name: Justin Ellis MRN: [id number] DOB:April 22, 1986, 38 y.o., male Today's Date: 10/08/2023  END OF SESSION:  PT End of Session - 10/08/23 1419     Visit Number 1    Number of Visits 12    Date for PT Re-Evaluation 11/19/23    PT Start Time 0103    PT Stop Time 0149    PT Time Calculation (min) 46 min    Activity Tolerance Patient tolerated treatment well    Behavior During Therapy [facility] for tasks assessed/performed             Past Medical History:  Diagnosis Date   Migraine headache    2-3x/wk   Vasovagal near syncope 03/10/2012   Past Surgical History:  Procedure Laterality Date   NO PAST SURGERIES     SEPTOPLASTY N/A 04/30/2017   Procedure: SEPTOPLASTY;  Surgeon: Vernie Murders, MD;  Location: [facility];  Service: ENT;  Laterality: N/A;  GAVE DISK TO [facility] 9-25-KP   TURBINATE REDUCTION Bilateral 04/30/2017   Procedure: BILATERAL INFERIOR TURBINATE REDUCTION;  Surgeon: Vernie Murders, MD;  Location: [facility];  Service: ENT;  Laterality: Bilateral;   Patient Active Problem List   Diagnosis Date Noted   Adverse food reaction 03/03/2017   Non-seasonal allergic rhinitis due to fungal spores 03/03/2017   SOB (shortness of breath) 03/03/2017   Vasovagal near syncope 03/10/2012   REFERRING PROVIDER: Donalee Citrin MD  REFERRING DIAG: Thoracic spine pain.  Rationale for Evaluation and Treatment: Rehabilitation  THERAPY DIAG:  Pain in thoracic spine - Plan: PT plan of care cert/re-cert  Muscle spasm of back - Plan: PT plan of care cert/re-cert  ONSET DATE: ~1.5 years.    SUBJECTIVE:                                                                                                                                                                                           SUBJECTIVE STATEMENT: The patient presents to the clinic with a CC of left mid-back pain that radiates into his left rib and  scapular region.  His pain today is rated at a 4/10.  His pain can rise to much higher levels with prolonged standing, sitting, lifting and walking.  His pain limits him from lifting heavy weight.    PERTINENT HISTORY:  See above.  PAIN:  Are you having pain? Yes: NPRS scale: 4/10. Pain location: Left mid-thoracic. Pain description: Ache, sore, throbbing.  "Persistent, never completely goes away." Aggravating factors: As above.   Relieving factors: Lying down.  Heat.  PRECAUTIONS: None  RED FLAGS: None  WEIGHT BEARING RESTRICTIONS: No  FALLS:  Has patient fallen in last 6 months? No  LIVING ENVIRONMENT: Lives with: lives with their spouse Lives in: House/apartment Has following equipment at home: None  OCCUPATION: Works as Merchandiser, retail at a Teaching laboratory technician.    PLOF: Independent  PATIENT GOALS: Get out of pain.     OBJECTIVE:  Note: Objective measures were completed at Evaluation unless otherwise noted.  DIAGNOSTIC FINDINGS:  Per MD note:  "Thoracic MRI overall looks good."    PATIENT SURVEYS:  Modified Oswestry 18/50.      POSTURE: rounded shoulders and forward head  PALPATION: Very tender to palpation just left of T6 to T8 with significant paraspinal muscle guarding.    ROM:  Full active trunk flexion.     LOWER EXTREMITY MMT:    Normal UE strength.  DTR's:   UE DTR's are 1+/4+.    GAIT:  TREATMENT DATE: 10/08/23:   HMP and IFC at 80-150 Hx on 40% scan x 20 minutes to patient's left affected mid-thoracic region.    Normal modality response following removal of modality.                                                                                                                               PATIENT EDUCATION:  Education details:  Person educated:  International aid/development worker:  Education comprehension:   HOME EXERCISE PROGRAM:   ASSESSMENT:  CLINICAL IMPRESSION: The patient presents to [facility] with c/o chronic left-sided thoracic  pain that has been ongoing for about a year and a half.  He pain will reach high levels with increased activity.   He is very tender to palpation just left of T6 to T8 with significant paraspinal muscle guarding.  Pain will radiate into his left rib and scapular region.  His UE strength is normal.  His Modified Owestry score is 18/50.  Patient will benefit from skilled physical therapy intervention to address pain and deficits.   OBJECTIVE IMPAIRMENTS: decreased activity tolerance, increased muscle spasms, postural dysfunction, and pain.   ACTIVITY LIMITATIONS: carrying, lifting, and bending  PARTICIPATION LIMITATIONS: meal prep, cleaning, laundry, occupation, and yard work  PERSONAL FACTORS: Time since onset of injury/illness/exacerbation are also affecting patient's functional outcome.   REHAB POTENTIAL: Good  CLINICAL DECISION MAKING: Evolving/moderate complexity  EVALUATION COMPLEXITY: Low   GOALS: LONG TERM GOALS: Target date: 11/19/23  Ind with a HEP.  Goal status: INITIAL  2.  Sit 30 minutes with pain not > 2-3/10.  Goal status: INITIAL  3.  Walk a community distance with pain not > 2-3/10. Baseline:  Goal status: INITIAL  4.  Perform ADL's with pain not > 2-3/10.  Goal status: INITIAL  PLAN:  PT FREQUENCY: 2x/week  PT DURATION: 6 weeks  PLANNED INTERVENTIONS: 97110-Therapeutic exercises, 97530- Therapeutic activity, [id number]- Neuromuscular re-education, 97535- Self Care, 86578- Manual therapy, G0283- Electrical stimulation (unattended), 97035- Ultrasound, Patient/Family education, Joint mobilization, Spinal mobilization, Cryotherapy, and Moist heat.  PLAN FOR NEXT SESSION: Combo e'stim/US at 1.50 W/CM2, STW/M, gentle thoracic PA mobs and costo-vertebral mobs.  Postural exercises.   Susy Placzek, [place], PT 10/08/2023, 2:45 PM

## 2023-10-13 ENCOUNTER — Ambulatory Visit: Admitting: Physical Therapy

## 2023-10-13 DIAGNOSIS — M546 Pain in thoracic spine: Secondary | ICD-10-CM

## 2023-10-13 DIAGNOSIS — M62830 Muscle spasm of back: Secondary | ICD-10-CM

## 2023-10-13 NOTE — Therapy (Signed)
 OUTPATIENT PHYSICAL THERAPY THORACOLUMBAR TREATMENT  Patient Name: Justin Ellis MRN: [id number] DOB:October 12, 1985, 38 y.o., male Today's Date: 10/13/2023  END OF SESSION:  PT End of Session - 10/13/23 1705     Visit Number 2    Number of Visits 12    Date for PT Re-Evaluation 11/19/23    PT Start Time 0400    PT Stop Time 0452    PT Time Calculation (min) 52 min    Activity Tolerance Patient tolerated treatment well    Behavior During Therapy [facility] for tasks assessed/performed             Past Medical History:  Diagnosis Date   Migraine headache    2-3x/wk   Vasovagal near syncope 03/10/2012   Past Surgical History:  Procedure Laterality Date   NO PAST SURGERIES     SEPTOPLASTY N/A 04/30/2017   Procedure: SEPTOPLASTY;  Surgeon: Vernie Murders, MD;  Location: [facility];  Service: ENT;  Laterality: N/A;  GAVE DISK TO [facility] 9-25-KP   TURBINATE REDUCTION Bilateral 04/30/2017   Procedure: BILATERAL INFERIOR TURBINATE REDUCTION;  Surgeon: Vernie Murders, MD;  Location: [facility];  Service: ENT;  Laterality: Bilateral;   Patient Active Problem List   Diagnosis Date Noted   Adverse food reaction 03/03/2017   Non-seasonal allergic rhinitis due to fungal spores 03/03/2017   SOB (shortness of breath) 03/03/2017   Vasovagal near syncope 03/10/2012   REFERRING PROVIDER: Donalee Citrin MD  REFERRING DIAG: Thoracic spine pain.  Rationale for Evaluation and Treatment: Rehabilitation  THERAPY DIAG:  Pain in thoracic spine  Muscle spasm of back  ONSET DATE: ~1.5 years.    SUBJECTIVE:                                                                                                                                                                                           SUBJECTIVE STATEMENT: No new complaints.  Doing pretty good today.  PERTINENT HISTORY:  See above.  PAIN:  Are you having pain? Yes: NPRS scale: 4/10. Pain location: Left mid-thoracic. Pain  description: Ache, sore, throbbing.  "Persistent, never completely goes away." Aggravating factors: As above.   Relieving factors: Lying down.  Heat.  PRECAUTIONS: None  RED FLAGS: None   WEIGHT BEARING RESTRICTIONS: No  FALLS:  Has patient fallen in last 6 months? No  LIVING ENVIRONMENT: Lives with: lives with their spouse Lives in: House/apartment Has following equipment at home: None  OCCUPATION: Works as Merchandiser, retail at a Teaching laboratory technician.    PLOF: Independent  PATIENT GOALS: Get out of pain.     OBJECTIVE:  Note: Objective measures  were completed at Evaluation unless otherwise noted.  DIAGNOSTIC FINDINGS:  Per MD note:  "Thoracic MRI overall looks good."    PATIENT SURVEYS:  Modified Oswestry 18/50.      POSTURE: rounded shoulders and forward head  PALPATION: Very tender to palpation just left of T6 to T8 with significant paraspinal muscle guarding.    ROM:  Full active trunk flexion.     LOWER EXTREMITY MMT:    Normal UE strength.  DTR's:   UE DTR's are 1+/4+.    GAIT:  TREATMENT DATE: 10/13/23:  In prone with face in plinth face hole:  Combo e'stim/US at 1.50 W/CM2 at 1.50 W/CM2 x 12 minutes to patient's left affected mid-thoracic region f/b  STW/M x 11 minutes with gentle thoracic PA and costo-vertebral mobs f/b HMP and IFC at 80-150 Hx on 40% scan x 20 minutes to patient's left affected mid-thoracic region.    Normal modality response following removal of modality.                                                                                                                               PATIENT EDUCATION:  Education details:  Person educated:  International aid/development worker:  Education comprehension:   HOME EXERCISE PROGRAM:   ASSESSMENT:  CLINICAL IMPRESSION: Patient did very well today with combo e'stim/US, STW/M and gentle mobs to his affected thoracic spine.  He tolerated treatment without complaint.    OBJECTIVE IMPAIRMENTS:  decreased activity tolerance, increased muscle spasms, postural dysfunction, and pain.   ACTIVITY LIMITATIONS: carrying, lifting, and bending  PARTICIPATION LIMITATIONS: meal prep, cleaning, laundry, occupation, and yard work  PERSONAL FACTORS: Time since onset of injury/illness/exacerbation are also affecting patient's functional outcome.   REHAB POTENTIAL: Good  CLINICAL DECISION MAKING: Evolving/moderate complexity  EVALUATION COMPLEXITY: Low   GOALS: LONG TERM GOALS: Target date: 11/19/23  Ind with a HEP.  Goal status: INITIAL  2.  Sit 30 minutes with pain not > 2-3/10.  Goal status: INITIAL  3.  Walk a community distance with pain not > 2-3/10. Baseline:  Goal status: INITIAL  4.  Perform ADL's with pain not > 2-3/10.  Goal status: INITIAL  PLAN:  PT FREQUENCY: 2x/week  PT DURATION: 6 weeks  PLANNED INTERVENTIONS: 97110-Therapeutic exercises, 97530- Therapeutic activity, [id number]- Neuromuscular re-education, 97535- Self Care, [id number]- Manual therapy, G0283- Electrical stimulation (unattended), 97035- Ultrasound, Patient/Family education, Joint mobilization, Spinal mobilization, Cryotherapy, and Moist heat.  PLAN FOR NEXT SESSION: Combo e'stim/US at 1.50 W/CM2, STW/M, gentle thoracic PA mobs and costo-vertebral mobs.  Postural exercises.   Marvel Mcphillips, [place], PT 10/13/2023, 5:30 PM

## 2023-10-15 ENCOUNTER — Encounter: Admitting: Physical Therapy

## 2023-10-20 ENCOUNTER — Ambulatory Visit: Admitting: Physical Therapy

## 2023-10-20 DIAGNOSIS — M546 Pain in thoracic spine: Secondary | ICD-10-CM | POA: Diagnosis not present

## 2023-10-20 DIAGNOSIS — M62830 Muscle spasm of back: Secondary | ICD-10-CM

## 2023-10-20 NOTE — Therapy (Signed)
 OUTPATIENT PHYSICAL THERAPY THORACOLUMBAR TREATMENT  Patient Name: Justin Ellis MRN: [id number] DOB:09-02-1985, 38 y.o., male Today's Date: 10/20/2023  END OF SESSION:  PT End of Session - 10/20/23 1652     Visit Number 3    Number of Visits 12    Date for PT Re-Evaluation 11/19/23    PT Start Time 0400    PT Stop Time 0456    PT Time Calculation (min) 56 min    Activity Tolerance Patient tolerated treatment well    Behavior During Therapy [facility] for tasks assessed/performed             Past Medical History:  Diagnosis Date   Migraine headache    2-3x/wk   Vasovagal near syncope 03/10/2012   Past Surgical History:  Procedure Laterality Date   NO PAST SURGERIES     SEPTOPLASTY N/A 04/30/2017   Procedure: SEPTOPLASTY;  Surgeon: Vernie Murders, MD;  Location: [facility];  Service: ENT;  Laterality: N/A;  GAVE DISK TO [facility] 9-25-KP   TURBINATE REDUCTION Bilateral 04/30/2017   Procedure: BILATERAL INFERIOR TURBINATE REDUCTION;  Surgeon: Vernie Murders, MD;  Location: [facility];  Service: ENT;  Laterality: Bilateral;   Patient Active Problem List   Diagnosis Date Noted   Adverse food reaction 03/03/2017   Non-seasonal allergic rhinitis due to fungal spores 03/03/2017   SOB (shortness of breath) 03/03/2017   Vasovagal near syncope 03/10/2012   REFERRING PROVIDER: Donalee Citrin MD  REFERRING DIAG: Thoracic spine pain.  Rationale for Evaluation and Treatment: Rehabilitation  THERAPY DIAG:  Pain in thoracic spine  Muscle spasm of back  ONSET DATE: ~1.5 years.    SUBJECTIVE:                                                                                                                                                                                           SUBJECTIVE STATEMENT: Pain has been staying around a 2.  Some pain increase when driving.   PERTINENT HISTORY:  See above.  PAIN:  Are you having pain? Yes: NPRS scale: 2/10. Pain location: Left  mid-thoracic. Pain description: Ache, sore, throbbing.  "Persistent, never completely goes away." Aggravating factors: As above.   Relieving factors: Lying down.  Heat.  PRECAUTIONS: None  RED FLAGS: None   WEIGHT BEARING RESTRICTIONS: No  FALLS:  Has patient fallen in last 6 months? No  LIVING ENVIRONMENT: Lives with: lives with their spouse Lives in: House/apartment Has following equipment at home: None  OCCUPATION: Works as Merchandiser, retail at a Teaching laboratory technician.    PLOF: Independent  PATIENT GOALS: Get out of pain.  OBJECTIVE:  Note: Objective measures were completed at Evaluation unless otherwise noted.  DIAGNOSTIC FINDINGS:  Per MD note:  "Thoracic MRI overall looks good."    PATIENT SURVEYS:  Modified Oswestry 18/50.      POSTURE: rounded shoulders and forward head  PALPATION: Very tender to palpation just left of T6 to T8 with significant paraspinal muscle guarding.    ROM:  Full active trunk flexion.     LOWER EXTREMITY MMT:    Normal UE strength.  DTR's:   UE DTR's are 1+/4+.    GAIT:  TREATMENT DATE:   10/20/23:  In prone with face in plinth face hole:  Combo e'stim/US at 1.50 W/CM2 x 12 minutes to patient's left affected mid-thoracic region f/b  STW/M x 12 minutes with gentle thoracic PA and costo-vertebral mobs f/b HMP and IFC at 80-150 Hx on 40% scan x 20 minutes to patient's left affected mid-thoracic region.    Normal modality response following removal of modality.                                                                                                                              10/13/23:  In prone with face in plinth face hole:  Combo e'stim/US at 1.50 W/CM2 at 1.50 W/CM2 x 12 minutes to patient's left affected mid-thoracic region f/b  STW/M x 11 minutes with gentle thoracic PA and costo-vertebral mobs f/b HMP and IFC at 80-150 Hx on 40% scan x 20 minutes to patient's left affected mid-thoracic region.    Normal  modality response following removal of modality.                                                                                                                               PATIENT EDUCATION:  Education details: Scapular retraction.  Person educated: Patient Education method: Demo Education comprehension: Excellent  HOME EXERCISE PROGRAM:   ASSESSMENT:  CLINICAL IMPRESSION: Patient with good response to last treatment with a consistent reduction in pain.  Patient provided with red theraband for HEP to perform scapular retraction.    OBJECTIVE IMPAIRMENTS: decreased activity tolerance, increased muscle spasms, postural dysfunction, and pain.   ACTIVITY LIMITATIONS: carrying, lifting, and bending  PARTICIPATION LIMITATIONS: meal prep, cleaning, laundry, occupation, and yard work  PERSONAL FACTORS: Time since onset of injury/illness/exacerbation are also affecting patient's functional outcome.   REHAB POTENTIAL: Good  CLINICAL DECISION MAKING: Evolving/moderate  complexity  EVALUATION COMPLEXITY: Low   GOALS: LONG TERM GOALS: Target date: 11/19/23  Ind with a HEP.  Goal status: INITIAL  2.  Sit 30 minutes with pain not > 2-3/10.  Goal status: INITIAL  3.  Walk a community distance with pain not > 2-3/10. Baseline:  Goal status: INITIAL  4.  Perform ADL's with pain not > 2-3/10.  Goal status: INITIAL  PLAN:  PT FREQUENCY: 2x/week  PT DURATION: 6 weeks  PLANNED INTERVENTIONS: 97110-Therapeutic exercises, 97530- Therapeutic activity, [id number]- Neuromuscular re-education, 97535- Self Care, 16109- Manual therapy, G0283- Electrical stimulation (unattended), 97035- Ultrasound, Patient/Family education, Joint mobilization, Spinal mobilization, Cryotherapy, and Moist heat.  PLAN FOR NEXT SESSION: Combo e'stim/US at 1.50 W/CM2, STW/M, gentle thoracic PA mobs and costo-vertebral mobs.  Postural exercises.   Sherryn Pollino, [place], PT 10/20/2023, 5:25 PM

## 2023-10-22 ENCOUNTER — Ambulatory Visit: Admitting: Physical Therapy

## 2023-10-22 DIAGNOSIS — M546 Pain in thoracic spine: Secondary | ICD-10-CM

## 2023-10-22 DIAGNOSIS — M62830 Muscle spasm of back: Secondary | ICD-10-CM

## 2023-10-22 NOTE — Therapy (Signed)
 OUTPATIENT PHYSICAL THERAPY THORACOLUMBAR TREATMENT  Patient Name: Justin Ellis MRN: [id number] DOB:[birth year], 38 y.o., male Today's Date: 10/22/2023  END OF SESSION:  PT End of Session - 10/22/23 1741     Visit Number 4    Number of Visits 12    Date for PT Re-Evaluation 11/19/23    PT Start Time 0358    PT Stop Time 0456    PT Time Calculation (min) 58 min    Activity Tolerance Patient tolerated treatment well    Behavior During Therapy [facility] for tasks assessed/performed              Past Medical History:  Diagnosis Date   Migraine headache    2-3x/wk   Vasovagal near syncope 03/10/2012   Past Surgical History:  Procedure Laterality Date   NO PAST SURGERIES     SEPTOPLASTY N/A 04/30/2017   Procedure: SEPTOPLASTY;  Surgeon: Vernie Murders, MD;  Location: [facility];  Service: ENT;  Laterality: N/A;  GAVE DISK TO CECE 9-25-KP   TURBINATE REDUCTION Bilateral 04/30/2017   Procedure: BILATERAL INFERIOR TURBINATE REDUCTION;  Surgeon: Vernie Murders, MD;  Location: [facility];  Service: ENT;  Laterality: Bilateral;   Patient Active Problem List   Diagnosis Date Noted   Adverse food reaction 03/03/2017   Non-seasonal allergic rhinitis due to fungal spores 03/03/2017   SOB (shortness of breath) 03/03/2017   Vasovagal near syncope 03/10/2012   REFERRING PROVIDER: Donalee Citrin MD  REFERRING DIAG: Thoracic spine pain.  Rationale for Evaluation and Treatment: Rehabilitation  THERAPY DIAG:  Pain in thoracic spine  Muscle spasm of back  ONSET DATE: ~1.5 years.    SUBJECTIVE:                                                                                                                                                                                           SUBJECTIVE STATEMENT: Was feeling good so wrestled with kids and pain increased but okay now.   PERTINENT HISTORY:  See above.  PAIN:  Are you having pain? Yes: NPRS scale: 2/10. Pain location:  Left mid-thoracic. Pain description: Ache, sore, throbbing.  "Persistent, never completely goes away." Aggravating factors: As above.   Relieving factors: Lying down.  Heat.  PRECAUTIONS: None  RED FLAGS: None   WEIGHT BEARING RESTRICTIONS: No  FALLS:  Has patient fallen in last 6 months? No  LIVING ENVIRONMENT: Lives with: lives with their spouse Lives in: House/apartment Has following equipment at home: None  OCCUPATION: Works as Merchandiser, retail at a Teaching laboratory technician.    PLOF: Independent  PATIENT GOALS: Get out of pain.  OBJECTIVE:  Note: Objective measures were completed at Evaluation unless otherwise noted.  DIAGNOSTIC FINDINGS:  Per MD note:  "Thoracic MRI overall looks good."    PATIENT SURVEYS:  Modified Oswestry 18/50.      POSTURE: rounded shoulders and forward head  PALPATION: Very tender to palpation just left of T6 to T8 with significant paraspinal muscle guarding.    ROM:  Full active trunk flexion.     LOWER EXTREMITY MMT:    Normal UE strength.  DTR's:   UE DTR's are 1+/4+.    GAIT:  TREATMENT DATE:   10/22/23:                                     EXERCISE LOG  Exercise Repetitions and Resistance Comments  UBE @90  RPM's x 8 minutes   Pulleys  5 minutes   XTS Blue tubing scap retraction and bilateral shoulder extension to fatigue.               In prone with face in plinth face hole: STW/M x 10 minutes with gentle thoracic PA and costo-vertebral mobs f/b HMP and IFC at 80-150 Hx on 40% scan x 20 minutes to patient's left affected mid-thoracic region.      10/20/23:  In prone with face in plinth face hole:  Combo e'stim/US at 1.50 W/CM2 x 12 minutes to patient's left affected mid-thoracic region f/b  STW/M x 12 minutes with gentle thoracic PA and costo-vertebral mobs f/b HMP and IFC at 80-150 Hx on 40% scan x 20 minutes to patient's left affected mid-thoracic region.    Normal modality response following removal of  modality.                                                                                                                              10/13/23:  In prone with face in plinth face hole:  Combo e'stim/US at 1.50 W/CM2 at 1.50 W/CM2 x 12 minutes to patient's left affected mid-thoracic region f/b  STW/M x 11 minutes with gentle thoracic PA and costo-vertebral mobs f/b HMP and IFC at 80-150 Hx on 40% scan x 20 minutes to patient's left affected mid-thoracic region.    Normal modality response following removal of modality.  PATIENT EDUCATION:  Education details: Scapular retraction.  Person educated: Patient Education method: Demo Education comprehension: Excellent  HOME EXERCISE PROGRAM: HOME EXERCISE PROGRAM [[id number]]  Scapular retraction -  Repeat 15 Repetitions, Hold 2 Seconds, Complete 2 Sets, Perform 2 Times a Day  ASSESSMENT:  CLINICAL IMPRESSION: Patient progressing well.  He had a mild flare-up from wrestling with his kids but doing well today.  Added UBE, pulleys and blue XTS band for scapular exercise.  Handout provided.  He is pleased with his progress thus far.    OBJECTIVE IMPAIRMENTS: decreased activity tolerance, increased muscle spasms, postural dysfunction, and pain.   ACTIVITY LIMITATIONS: carrying, lifting, and bending  PARTICIPATION LIMITATIONS: meal prep, cleaning, laundry, occupation, and yard work  PERSONAL FACTORS: Time since onset of injury/illness/exacerbation are also affecting patient's functional outcome.   REHAB POTENTIAL: Good  CLINICAL DECISION MAKING: Evolving/moderate complexity  EVALUATION COMPLEXITY: Low   GOALS: LONG TERM GOALS: Target date: 11/19/23  Ind with a HEP.  Goal status: INITIAL  2.  Sit 30 minutes with pain not > 2-3/10.  Goal status: INITIAL  3.  Walk a community distance with pain not >  2-3/10. Baseline:  Goal status: INITIAL  4.  Perform ADL's with pain not > 2-3/10.  Goal status: INITIAL  PLAN:  PT FREQUENCY: 2x/week  PT DURATION: 6 weeks  PLANNED INTERVENTIONS: 97110-Therapeutic exercises, 97530- Therapeutic activity, [id number]- Neuromuscular re-education, 97535- Self Care, 47829- Manual therapy, G0283- Electrical stimulation (unattended), 97035- Ultrasound, Patient/Family education, Joint mobilization, Spinal mobilization, Cryotherapy, and Moist heat.  PLAN FOR NEXT SESSION: Combo e'stim/US at 1.50 W/CM2, STW/M, gentle thoracic PA mobs and costo-vertebral mobs.  Postural exercises.   Yareli Carthen, [place], PT 10/22/2023, 5:43 PM

## 2023-10-27 ENCOUNTER — Ambulatory Visit: Attending: Neurosurgery | Admitting: Physical Therapy

## 2023-10-27 DIAGNOSIS — M546 Pain in thoracic spine: Secondary | ICD-10-CM | POA: Insufficient documentation

## 2023-10-27 DIAGNOSIS — M62830 Muscle spasm of back: Secondary | ICD-10-CM | POA: Diagnosis present

## 2023-10-27 NOTE — Therapy (Signed)
 OUTPATIENT PHYSICAL THERAPY THORACOLUMBAR TREATMENT  Patient Name: Justin Ellis MRN: [id number] DOB:[birth year], 38 y.o., male Today's Date: 10/27/2023  END OF SESSION:  PT End of Session - 10/27/23 1604     Visit Number 5    Number of Visits 12    Date for PT Re-Evaluation 11/19/23    PT Start Time 0355    PT Stop Time 0450    PT Time Calculation (min) 55 min    Activity Tolerance Patient tolerated treatment well    Behavior During Therapy [facility] for tasks assessed/performed              Past Medical History:  Diagnosis Date   Migraine headache    2-3x/wk   Vasovagal near syncope 03/10/2012   Past Surgical History:  Procedure Laterality Date   NO PAST SURGERIES     SEPTOPLASTY N/A 04/30/2017   Procedure: SEPTOPLASTY;  Surgeon: Vernie Murders, MD;  Location: [facility];  Service: ENT;  Laterality: N/A;  GAVE DISK TO CE[facility] 9-25-KP   TURBINATE REDUCTION Bilateral 04/30/2017   Procedure: BILATERAL INFERIOR TURBINATE REDUCTION;  Surgeon: Vernie Murders, MD;  Location: [facility];  Service: ENT;  Laterality: Bilateral;   Patient Active Problem List   Diagnosis Date Noted   Adverse food reaction 03/03/2017   Non-seasonal allergic rhinitis due to fungal spores 03/03/2017   SOB (shortness of breath) 03/03/2017   Vasovagal near syncope 03/10/2012   REFERRING PROVIDER: Donalee Citrin MD  REFERRING DIAG: Thoracic spine pain.  Rationale for Evaluation and Treatment: Rehabilitation  THERAPY DIAG:  Pain in thoracic spine  Muscle spasm of back  ONSET DATE: ~1.5 years.    SUBJECTIVE:                                                                                                                                                                                           SUBJECTIVE STATEMENT: Doing better.  Pain around a 2.  Feeling about 50-60 better.   PERTINENT HISTORY:  See above.  PAIN:  Are you having pain? Yes: NPRS scale: 2/10. Pain location: Left  mid-thoracic. Pain description: Ache, sore, throbbing.  "Persistent, never completely goes away." Aggravating factors: As above.   Relieving factors: Lying down.  Heat.  PRECAUTIONS: None  RED FLAGS: None   WEIGHT BEARING RESTRICTIONS: No  FALLS:  Has patient fallen in last 6 months? No  LIVING ENVIRONMENT: Lives with: lives with their spouse Lives in: House/apartment Has following equipment at home: None  OCCUPATION: Works as Merchandiser, retail at a Teaching laboratory technician.    PLOF: Independent  PATIENT GOALS: Get out of pain.  OBJECTIVE:  Note: Objective measures were completed at Evaluation unless otherwise noted.  DIAGNOSTIC FINDINGS:  Per MD note:  "Thoracic MRI overall looks good."    PATIENT SURVEYS:  Modified Oswestry 18/50.      POSTURE: rounded shoulders and forward head  PALPATION: Very tender to palpation just left of T6 to T8 with significant paraspinal muscle guarding.    ROM:  Full active trunk flexion.     LOWER EXTREMITY MMT:    Normal UE strength.  DTR's:   UE DTR's are 1+/4+.    GAIT:  TREATMENT DATE:   10/27/23:                                     EXERCISE LOG  Exercise Repetitions and Resistance Comments  UBE 90 RPM's x 8 minutes   Lat PD on Apollo mac 50# 2 sets to fatigue   Scap retraction on Apollo machine 50# 2 sets to fatigue              In prone with face in plinth face hole: STW/M x 13 minutes with gentle thoracic PA and costo-vertebral mobs f/b HMP and IFC at 80-150 Hx on 40% scan x 20 minutes to patient's left affected mid-thoracic region.    10/22/23:                                     EXERCISE LOG  Exercise Repetitions and Resistance Comments  UBE @90  RPM's x 8 minutes   Pulleys  5 minutes   XTS Blue tubing scap retraction and bilateral shoulder extension to fatigue.               In prone with face in plinth face hole: STW/M x 10 minutes with gentle thoracic PA and costo-vertebral mobs f/b HMP and  IFC at 80-150 Hx on 40% scan x 20 minutes to patient's left affected mid-thoracic region.                                                                                                                             PATIENT EDUCATION:  Education details: Scapular retraction.  Person educated: Patient Education method: Demo Education comprehension: Excellent  HOME EXERCISE PROGRAM: HOME EXERCISE PROGRAM [[id number]]  Scapular retraction -  Repeat 15 Repetitions, Hold 2 Seconds, Complete 2 Sets, Perform 2 Times a Day  ASSESSMENT:  CLINICAL IMPRESSION: Patient continues to improve.  Added circuit weigh machines which patient performed with excellent technique and without complaint.    OBJECTIVE IMPAIRMENTS: decreased activity tolerance, increased muscle spasms, postural dysfunction, and pain.   ACTIVITY LIMITATIONS: carrying, lifting, and bending  PARTICIPATION LIMITATIONS: meal prep, cleaning, laundry, occupation, and yard work  PERSONAL FACTORS: Time since onset of injury/illness/exacerbation are also affecting patient's functional outcome.   REHAB POTENTIAL: Good  CLINICAL DECISION MAKING: Evolving/moderate complexity  EVALUATION COMPLEXITY: Low   GOALS: LONG TERM GOALS: Target date: 11/19/23  Ind with a HEP.  Goal status: INITIAL  2.  Sit 30 minutes with pain not > 2-3/10.  Goal status: INITIAL  3.  Walk a community distance with pain not > 2-3/10. Baseline:  Goal status: INITIAL  4.  Perform ADL's with pain not > 2-3/10.  Goal status: INITIAL  PLAN:  PT FREQUENCY: 2x/week  PT DURATION: 6 weeks  PLANNED INTERVENTIONS: 97110-Therapeutic exercises, 97530- Therapeutic activity, [id number]- Neuromuscular re-education, 97535- Self Care, 16109- Manual therapy, G0283- Electrical stimulation (unattended), 97035- Ultrasound, Patient/Family education, Joint mobilization, Spinal mobilization, Cryotherapy, and Moist heat.  PLAN FOR NEXT SESSION: Combo e'stim/US at 1.50 W/CM2,  STW/M, gentle thoracic PA mobs and costo-vertebral mobs.  Postural exercises.   Lior Hoen, [place], PT 10/27/2023, 5:12 PM

## 2023-10-29 ENCOUNTER — Ambulatory Visit: Admitting: Physical Therapy

## 2023-10-29 DIAGNOSIS — M62830 Muscle spasm of back: Secondary | ICD-10-CM

## 2023-10-29 DIAGNOSIS — M546 Pain in thoracic spine: Secondary | ICD-10-CM

## 2023-10-29 NOTE — Therapy (Signed)
 OUTPATIENT PHYSICAL THERAPY THORACOLUMBAR TREATMENT  Patient Name: Justin Ellis MRN: [id number] DOB:June 19, 1986, 38 y.o., male Today's Date: 10/29/2023  END OF SESSION:  PT End of Session - 10/29/23 1642     Visit Number 6    Number of Visits 12    Date for PT Re-Evaluation 11/19/23    PT Start Time 0400    PT Stop Time 0456    PT Time Calculation (min) 56 min    Activity Tolerance Patient tolerated treatment well    Behavior During Therapy [facility] for tasks assessed/performed               Past Medical History:  Diagnosis Date   Migraine headache    2-3x/wk   Vasovagal near syncope 03/10/2012   Past Surgical History:  Procedure Laterality Date   NO PAST SURGERIES     SEPTOPLASTY N/A 04/30/2017   Procedure: SEPTOPLASTY;  Surgeon: Vernie Murders, MD;  Location: [facility];  Service: ENT;  Laterality: N/A;  GAVE DISK TO CE[facility] 9-25-KP   TURBINATE REDUCTION Bilateral 04/30/2017   Procedure: BILATERAL INFERIOR TURBINATE REDUCTION;  Surgeon: Vernie Murders, MD;  Location: [facility];  Service: ENT;  Laterality: Bilateral;   Patient Active Problem List   Diagnosis Date Noted   Adverse food reaction 03/03/2017   Non-seasonal allergic rhinitis due to fungal spores 03/03/2017   SOB (shortness of breath) 03/03/2017   Vasovagal near syncope 03/10/2012   REFERRING PROVIDER: Donalee Citrin MD  REFERRING DIAG: Thoracic spine pain.  Rationale for Evaluation and Treatment: Rehabilitation  THERAPY DIAG:  Pain in thoracic spine  Muscle spasm of back  ONSET DATE: ~1.5 years.    SUBJECTIVE:                                                                                                                                                                                           SUBJECTIVE STATEMENT: Able to till garden with almost no pain. PERTINENT HISTORY:  See above.  PAIN:  Are you having pain? Yes: NPRS scale: 1/10. Pain location: Left mid-thoracic. Pain  description: Ache, sore, throbbing.  "Persistent, never completely goes away." Aggravating factors: As above.   Relieving factors: Lying down.  Heat.  PRECAUTIONS: None  RED FLAGS: None   WEIGHT BEARING RESTRICTIONS: No  FALLS:  Has patient fallen in last 6 months? No  LIVING ENVIRONMENT: Lives with: lives with their spouse Lives in: House/apartment Has following equipment at home: None  OCCUPATION: Works as Merchandiser, retail at a Teaching laboratory technician.    PLOF: Independent  PATIENT GOALS: Get out of pain.     OBJECTIVE:  Note: Objective  measures were completed at Evaluation unless otherwise noted.  DIAGNOSTIC FINDINGS:  Per MD note:  "Thoracic MRI overall looks good."    PATIENT SURVEYS:  Modified Oswestry 18/50.      POSTURE: rounded shoulders and forward head  PALPATION: Very tender to palpation just left of T6 to T8 with significant paraspinal muscle guarding.    ROM:  Full active trunk flexion.     LOWER EXTREMITY MMT:    Normal UE strength.  DTR's:   UE DTR's are 1+/4+.    GAIT:  TREATMENT DATE:   10/29/23:                                     EXERCISE LOG  Exercise Repetitions and Resistance Comments  UBE @90  RPM's x 8 minutes   Lat PD 50# 2 sets to fatigue   Scap retraction 50# 2 sets to fatigue   Cable pulls (low elbows) on Apollo machine 40# 2 sets to fatigue         In prone with face in plinth face hole: STW/M x 8 minutes with gentle thoracic PA and costo-vertebral mobs f/b HMP and IFC at 80-150 Hx on 40% scan x 20 minutes to patient's left affected mid-thoracic region.    10/27/23:                                     EXERCISE LOG  Exercise Repetitions and Resistance Comments  UBE 90 RPM's x 8 minutes   Lat PD on Apollo mac 50# 2 sets to fatigue   Scap retraction on Apollo machine 50# 2 sets to fatigue              In prone with face in plinth face hole: STW/M x 13 minutes with gentle thoracic PA and costo-vertebral mobs  f/b HMP and IFC at 80-150 Hx on 40% scan x 20 minutes to patient's left affected mid-thoracic region.    10/22/23:                                     EXERCISE LOG  Exercise Repetitions and Resistance Comments  UBE @90  RPM's x 8 minutes   Pulleys  5 minutes   XTS Blue tubing scap retraction and bilateral shoulder extension to fatigue.               In prone with face in plinth face hole: STW/M x 10 minutes with gentle thoracic PA and costo-vertebral mobs f/b HMP and IFC at 80-150 Hx on 40% scan x 20 minutes to patient's left affected mid-thoracic region.  PATIENT EDUCATION:  Education details: Scapular retraction.  Person educated: Patient Education method: Demo Education comprehension: Excellent  HOME EXERCISE PROGRAM: HOME EXERCISE PROGRAM [[id number]]  Scapular retraction -  Repeat 15 Repetitions, Hold 2 Seconds, Complete 2 Sets, Perform 2 Times a Day  ASSESSMENT:  CLINICAL IMPRESSION: Patient doing very well.  His was able to till his garden for about an hour with a low pain-level. Added cable pulls (low elbows) on Apollo machine without complaint.  OBJECTIVE IMPAIRMENTS: decreased activity tolerance, increased muscle spasms, postural dysfunction, and pain.   ACTIVITY LIMITATIONS: carrying, lifting, and bending  PARTICIPATION LIMITATIONS: meal prep, cleaning, laundry, occupation, and yard work  PERSONAL FACTORS: Time since onset of injury/illness/exacerbation are also affecting patient's functional outcome.   REHAB POTENTIAL: Good  CLINICAL DECISION MAKING: Evolving/moderate complexity  EVALUATION COMPLEXITY: Low   GOALS: LONG TERM GOALS: Target date: 11/19/23  Ind with a HEP.  Goal status: INITIAL  2.  Sit 30 minutes with pain not > 2-3/10.  Goal status: INITIAL  3.  Walk a community distance with pain not > 2-3/10. Baseline:  Goal  status: INITIAL  4.  Perform ADL's with pain not > 2-3/10.  Goal status: INITIAL  PLAN:  PT FREQUENCY: 2x/week  PT DURATION: 6 weeks  PLANNED INTERVENTIONS: 97110-Therapeutic exercises, 97530- Therapeutic activity, [id number]- Neuromuscular re-education, 97535- Self Care, 19147- Manual therapy, G0283- Electrical stimulation (unattended), 97035- Ultrasound, Patient/Family education, Joint mobilization, Spinal mobilization, Cryotherapy, and Moist heat.  PLAN FOR NEXT SESSION: Combo e'stim/US at 1.50 W/CM2, STW/M, gentle thoracic PA mobs and costo-vertebral mobs.  Postural exercises.   Katelinn Justice, [place], PT 10/29/2023, 5:31 PM

## 2023-11-03 ENCOUNTER — Ambulatory Visit: Admitting: Physical Therapy

## 2023-11-03 DIAGNOSIS — M62830 Muscle spasm of back: Secondary | ICD-10-CM

## 2023-11-03 DIAGNOSIS — M546 Pain in thoracic spine: Secondary | ICD-10-CM

## 2023-11-03 NOTE — Therapy (Signed)
 OUTPATIENT PHYSICAL THERAPY THORACOLUMBAR TREATMENT  Patient Name: Justin Ellis MRN: [id number] DOB:05-18-1986, 38 y.o., male Today's Date: 11/03/2023  END OF SESSION:  PT End of Session - 11/03/23 1617     Visit Number 7    Number of Visits 12    Date for PT Re-Evaluation 11/19/23    PT Start Time 0400    PT Stop Time 0458    PT Time Calculation (min) 58 min    Activity Tolerance Patient tolerated treatment well    Behavior During Therapy [facility] for tasks assessed/performed               Past Medical History:  Diagnosis Date   Migraine headache    2-3x/wk   Vasovagal near syncope 03/10/2012   Past Surgical History:  Procedure Laterality Date   NO PAST SURGERIES     SEPTOPLASTY N/A 04/30/2017   Procedure: SEPTOPLASTY;  Surgeon: Vernie Murders, MD;  Location: [facility];  Service: ENT;  Laterality: N/A;  GAVE DISK TO CE[facility] 9-25-KP   TURBINATE REDUCTION Bilateral 04/30/2017   Procedure: BILATERAL INFERIOR TURBINATE REDUCTION;  Surgeon: Vernie Murders, MD;  Location: [facility];  Service: ENT;  Laterality: Bilateral;   Patient Active Problem List   Diagnosis Date Noted   Adverse food reaction 03/03/2017   Non-seasonal allergic rhinitis due to fungal spores 03/03/2017   SOB (shortness of breath) 03/03/2017   Vasovagal near syncope 03/10/2012   REFERRING PROVIDER: Donalee Citrin MD  REFERRING DIAG: Thoracic spine pain.  Rationale for Evaluation and Treatment: Rehabilitation  THERAPY DIAG:  Pain in thoracic spine  Muscle spasm of back  ONSET DATE: ~1.5 years.    SUBJECTIVE:                                                                                                                                                                                           SUBJECTIVE STATEMENT: Wrestled with son.  Pain very low. PERTINENT HISTORY:  See above.  PAIN:  Are you having pain? Yes: NPRS scale: Very low/10. Pain location: Left mid-thoracic. Pain  description: Ache, sore, throbbing.  "Persistent, never completely goes away." Aggravating factors: As above.   Relieving factors: Lying down.  Heat.  PRECAUTIONS: None  RED FLAGS: None   WEIGHT BEARING RESTRICTIONS: No  FALLS:  Has patient fallen in last 6 months? No  LIVING ENVIRONMENT: Lives with: lives with their spouse Lives in: House/apartment Has following equipment at home: None  OCCUPATION: Works as Merchandiser, retail at a Teaching laboratory technician.    PLOF: Independent  PATIENT GOALS: Get out of pain.     OBJECTIVE:  Note: Objective  measures were completed at Evaluation unless otherwise noted.  DIAGNOSTIC FINDINGS:  Per MD note:  "Thoracic MRI overall looks good."    PATIENT SURVEYS:  Modified Oswestry 18/50.      POSTURE: rounded shoulders and forward head  PALPATION: Very tender to palpation just left of T6 to T8 with significant paraspinal muscle guarding.    ROM:  Full active trunk flexion.     LOWER EXTREMITY MMT:    Normal UE strength.  DTR's:   UE DTR's are 1+/4+.    GAIT:  TREATMENT DATE:   11/03/23:                                   EXERCISE LOG  Exercise Repetitions and Resistance Comments  UBE @90  RPM's x 10 minutes   Lat PD 50# 2 sets to fatigue   Scap retraction (low elbows) 50# 2 sets to fatigue   Cable pulls (high elbows) on Apollo machine 40# 2 sets to fatigue           10/29/23:                                     EXERCISE LOG  Exercise Repetitions and Resistance Comments  UBE @90  RPM's x 8 minutes   Lat PD 50# 2 sets to fatigue   Scap retraction 50# 2 sets to fatigue   Cable pulls (low elbows) on Apollo machine 40# 2 sets to fatigue         In prone with face in plinth face hole: STW/M x 8 minutes with gentle thoracic PA and costo-vertebral mobs f/b HMP and IFC at 80-150 Hx on 40% scan x 20 minutes to patient's left affected mid-thoracic region.      PATIENT EDUCATION:  Education details: Scapular  retraction.  Person educated: Patient Education method: Demo Education comprehension: Excellent  HOME EXERCISE PROGRAM: HOME EXERCISE PROGRAM [[id number]]  Scapular retraction -  Repeat 15 Repetitions, Hold 2 Seconds, Complete 2 Sets, Perform 2 Times a Day  ASSESSMENT:  CLINICAL IMPRESSION: Patient doing very well.  Pain has been staying low.   OBJECTIVE IMPAIRMENTS: decreased activity tolerance, increased muscle spasms, postural dysfunction, and pain.   ACTIVITY LIMITATIONS: carrying, lifting, and bending  PARTICIPATION LIMITATIONS: meal prep, cleaning, laundry, occupation, and yard work  PERSONAL FACTORS: Time since onset of injury/illness/exacerbation are also affecting patient's functional outcome.   REHAB POTENTIAL: Good  CLINICAL DECISION MAKING: Evolving/moderate complexity  EVALUATION COMPLEXITY: Low   GOALS: LONG TERM GOALS: Target date: 11/19/23  Ind with a HEP.  Goal status: INITIAL  2.  Sit 30 minutes with pain not > 2-3/10.  Goal status: INITIAL  3.  Walk a community distance with pain not > 2-3/10. Baseline:  Goal status: INITIAL  4.  Perform ADL's with pain not > 2-3/10.  Goal status: INITIAL  PLAN:  PT FREQUENCY: 2x/week  PT DURATION: 6 weeks  PLANNED INTERVENTIONS: 97110-Therapeutic exercises, 97530- Therapeutic activity, [id number]- Neuromuscular re-education, 97535- Self Care, 16109- Manual therapy, G0283- Electrical stimulation (unattended), 97035- Ultrasound, Patient/Family education, Joint mobilization, Spinal mobilization, Cryotherapy, and Moist heat.  PLAN FOR NEXT SESSION: Combo e'stim/US at 1.50 W/CM2, STW/M, gentle thoracic PA mobs and costo-vertebral mobs.  Postural exercises.   Renelda Kilian, [place], PT 11/03/2023, 4:58 PM

## 2023-11-05 ENCOUNTER — Encounter: Admitting: Physical Therapy

## 2023-11-10 ENCOUNTER — Encounter: Payer: Self-pay | Admitting: Physical Therapy

## 2023-11-10 ENCOUNTER — Ambulatory Visit: Admitting: Physical Therapy

## 2023-11-10 DIAGNOSIS — M546 Pain in thoracic spine: Secondary | ICD-10-CM | POA: Diagnosis not present

## 2023-11-10 DIAGNOSIS — M62830 Muscle spasm of back: Secondary | ICD-10-CM

## 2023-11-10 NOTE — Therapy (Addendum)
 OUTPATIENT PHYSICAL THERAPY THORACOLUMBAR TREATMENT  Patient Name: Justin Ellis MRN: [id number] DOB:[birth year], 38 y.o., male Today's Date: 11/10/2023  END OF SESSION:  PT End of Session - 11/10/23 1656     Visit Number 8    Number of Visits 12    Date for PT Re-Evaluation 11/19/23    PT Start Time 0400    PT Stop Time 0500    PT Time Calculation (min) 60 min    Activity Tolerance Patient tolerated treatment well    Behavior During Therapy [facility] for tasks assessed/performed               Past Medical History:  Diagnosis Date   Migraine headache    2-3x/wk   Vasovagal near syncope 03/10/2012   Past Surgical History:  Procedure Laterality Date   NO PAST SURGERIES     SEPTOPLASTY N/A 04/30/2017   Procedure: SEPTOPLASTY;  Surgeon: Mellody Sprout, MD;  Location: [facility];  Service: ENT;  Laterality: N/A;  GAVE DISK TO [facility] 9-25-KP   TURBINATE REDUCTION Bilateral 04/30/2017   Procedure: BILATERAL INFERIOR TURBINATE REDUCTION;  Surgeon: Mellody Sprout, MD;  Location: [facility];  Service: ENT;  Laterality: Bilateral;   Patient Active Problem List   Diagnosis Date Noted   Adverse food reaction 03/03/2017   Non-seasonal allergic rhinitis due to fungal spores 03/03/2017   SOB (shortness of breath) 03/03/2017   Vasovagal near syncope 03/10/2012   REFERRING PROVIDER: Gearl Keens MD  REFERRING DIAG: Thoracic spine pain.  Rationale for Evaluation and Treatment: Rehabilitation  THERAPY DIAG:  Pain in thoracic spine  Muscle spasm of back  ONSET DATE: ~1.5 years.    SUBJECTIVE:                                                                                                                                                                                           SUBJECTIVE STATEMENT: Pain up to 3-4/10 today.  PERTINENT HISTORY:  See above.  PAIN:  Are you having pain? Yes: NPRS scale: Very low/10. Pain location: Left mid-thoracic. Pain description:  Ache, sore, throbbing.  "Persistent, never completely goes away." Aggravating factors: As above.   Relieving factors: Lying down.  Heat.  PRECAUTIONS: None  RED FLAGS: None   WEIGHT BEARING RESTRICTIONS: No  FALLS:  Has patient fallen in last 6 months? No  LIVING ENVIRONMENT: Lives with: lives with their spouse Lives in: House/apartment Has following equipment at home: None  OCCUPATION: Works as Merchandiser, retail at a Teaching laboratory technician.    PLOF: Independent  PATIENT GOALS: Get out of pain.     OBJECTIVE:  Note: Objective measures  were completed at Evaluation unless otherwise noted.  DIAGNOSTIC FINDINGS:  Per MD note:  "Thoracic MRI overall looks good."    PATIENT SURVEYS:  Modified Oswestry 18/50.      POSTURE: rounded shoulders and forward head  PALPATION: Very tender to palpation just left of T6 to T8 with significant paraspinal muscle guarding.    ROM:  Full active trunk flexion.     LOWER EXTREMITY MMT:    Normal UE strength.  DTR's:   UE DTR's are 1+/4+.    GAIT:  TREATMENT DATE:   11/10/23:  UBE x 10 minutes f/b combo e'stim/US at 1.50 w/CM2 x 8 minutes to patient's affected thoracic region f/b STW/M x 10 minutes with gentle PA and costovertebral mobs f/b  HMP and IFC at 80-150 Hz on 40% scan x 20 minutes.     11/03/23:                                   EXERCISE LOG  Exercise Repetitions and Resistance Comments  UBE @90  RPM's x 10 minutes   Lat PD 50# 2 sets to fatigue   Scap retraction (low elbows) 50# 2 sets to fatigue   Cable pulls (high elbows) on Apollo machine 40# 2 sets to fatigue           10/29/23:                                     EXERCISE LOG  Exercise Repetitions and Resistance Comments  UBE @90  RPM's x 8 minutes   Lat PD 50# 2 sets to fatigue   Scap retraction 50# 2 sets to fatigue   Cable pulls (low elbows) on Apollo machine 40# 2 sets to fatigue         In prone with face in plinth face hole: STW/M x 8  minutes with gentle thoracic PA and costo-vertebral mobs f/b HMP and IFC at 80-150 Hx on 40% scan x 20 minutes to patient's left affected mid-thoracic region.      PATIENT EDUCATION:  Education details: Scapular retraction.  Person educated: Patient Education method: Demo Education comprehension: Excellent  HOME EXERCISE PROGRAM: HOME EXERCISE PROGRAM [[id number]]  Scapular retraction -  Repeat 15 Repetitions, Hold 2 Seconds, Complete 2 Sets, Perform 2 Times a Day  ASSESSMENT:  CLINICAL IMPRESSION: Patient presented to the clinic with some increase in pain though reports he is significantly better overall.  He did well with treatment today.  OBJECTIVE IMPAIRMENTS: decreased activity tolerance, increased muscle spasms, postural dysfunction, and pain.   ACTIVITY LIMITATIONS: carrying, lifting, and bending  PARTICIPATION LIMITATIONS: meal prep, cleaning, laundry, occupation, and yard work  PERSONAL FACTORS: Time since onset of injury/illness/exacerbation are also affecting patient's functional outcome.   REHAB POTENTIAL: Good  CLINICAL DECISION MAKING: Evolving/moderate complexity  EVALUATION COMPLEXITY: Low   GOALS: LONG TERM GOALS: Target date: 11/19/23  Ind with a HEP.  Goal status: INITIAL  2.  Sit 30 minutes with pain not > 2-3/10.  Goal status: INITIAL  3.  Walk a community distance with pain not > 2-3/10. Baseline:  Goal status: INITIAL  4.  Perform ADL's with pain not > 2-3/10.  Goal status: INITIAL  PLAN:  PT FREQUENCY: 2x/week  PT DURATION: 6 weeks  PLANNED INTERVENTIONS: 97110-Therapeutic exercises, 97530- Therapeutic activity, [id number]- Neuromuscular re-education, 97535- Self Care, 16109- Manual  therapy, G0283- Electrical stimulation (unattended), [id number]- Ultrasound, Patient/Family education, Joint mobilization, Spinal mobilization, Cryotherapy, and Moist heat.  PLAN FOR NEXT SESSION: Combo e'stim/US  at 1.50 W/CM2, STW/M, gentle thoracic PA mobs and  costo-vertebral mobs.  Postural exercises.   Stark Aguinaga, [place], PT 11/10/2023, 5:17 PM

## 2023-11-12 ENCOUNTER — Encounter: Admitting: Physical Therapy

## 2023-11-17 ENCOUNTER — Ambulatory Visit: Admitting: Physical Therapy

## 2023-11-17 DIAGNOSIS — M62830 Muscle spasm of back: Secondary | ICD-10-CM

## 2023-11-17 DIAGNOSIS — M546 Pain in thoracic spine: Secondary | ICD-10-CM

## 2023-11-17 NOTE — Therapy (Signed)
 OUTPATIENT PHYSICAL THERAPY THORACOLUMBAR TREATMENT  Patient Name: Justin Ellis MRN: [id number] DOB:[birth year], 38 y.o., male Today's Date: 11/17/2023  END OF SESSION:  PT End of Session - 11/17/23 1636     Visit Number 9    Number of Visits 12    Date for PT Re-Evaluation 11/19/23    PT Start Time 0400    PT Stop Time 0451    PT Time Calculation (min) 51 min    Activity Tolerance Patient tolerated treatment well    Behavior During Therapy [facility] for tasks assessed/performed               Past Medical History:  Diagnosis Date   Migraine headache    2-3x/wk   Vasovagal near syncope 03/10/2012   Past Surgical History:  Procedure Laterality Date   NO PAST SURGERIES     SEPTOPLASTY N/A 04/30/2017   Procedure: SEPTOPLASTY;  Surgeon: Mellody Sprout, MD;  Location: [facility];  Service: ENT;  Laterality: N/A;  GAVE DISK TO CE[facility] 9-25-KP   TURBINATE REDUCTION Bilateral 04/30/2017   Procedure: BILATERAL INFERIOR TURBINATE REDUCTION;  Surgeon: Mellody Sprout, MD;  Location: [facility];  Service: ENT;  Laterality: Bilateral;   Patient Active Problem List   Diagnosis Date Noted   Adverse food reaction 03/03/2017   Non-seasonal allergic rhinitis due to fungal spores 03/03/2017   SOB (shortness of breath) 03/03/2017   Vasovagal near syncope 03/10/2012   REFERRING PROVIDER: Gearl Keens MD  REFERRING DIAG: Thoracic spine pain.  Rationale for Evaluation and Treatment: Rehabilitation  THERAPY DIAG:  Pain in thoracic spine  Muscle spasm of back  ONSET DATE: ~1.5 years.    SUBJECTIVE:                                                                                                                                                                                           SUBJECTIVE STATEMENT: Pain at a 3.  PERTINENT HISTORY:  See above.  PAIN:  Are you having pain? Yes: NPRS scale: 3/10. Pain location: Left mid-thoracic. Pain description: Ache, sore,  throbbing.  "Persistent, never completely goes away." Aggravating factors: As above.   Relieving factors: Lying down.  Heat.  PRECAUTIONS: None  RED FLAGS: None   WEIGHT BEARING RESTRICTIONS: No  FALLS:  Has patient fallen in last 6 months? No  LIVING ENVIRONMENT: Lives with: lives with their spouse Lives in: House/apartment Has following equipment at home: None  OCCUPATION: Works as Merchandiser, retail at a Teaching laboratory technician.    PLOF: Independent  PATIENT GOALS: Get out of pain.     OBJECTIVE:  Note: Objective measures were completed  at Evaluation unless otherwise noted.  DIAGNOSTIC FINDINGS:  Per MD note:  "Thoracic MRI overall looks good."    PATIENT SURVEYS:  Modified Oswestry 18/50.      POSTURE: rounded shoulders and forward head  PALPATION: Very tender to palpation just left of T6 to T8 with significant paraspinal muscle guarding.    ROM:  Full active trunk flexion.     LOWER EXTREMITY MMT:    Normal UE strength.  DTR's:   UE DTR's are 1+/4+.    GAIT:  TREATMENT DATE:   11/17/23:  UBE x 10 minutes f/b STW/M x 13 minutes left of T6-8 musculature f/b HMP and IFC at 80-150 Hz on 40% scan x 20 minutes.     11/10/23:  UBE x 10 minutes f/b combo e'stim/US  at 1.50 w/CM2 x 8 minutes to patient's affected thoracic region f/b STW/M x 10 minutes with gentle PA and costovertebral mobs f/b  HMP and IFC at 80-150 Hz on 40% scan x 20 minutes.    PATIENT EDUCATION:  Education details: Scapular retraction.  Person educated: Patient Education method: Demo Education comprehension: Excellent  HOME EXERCISE PROGRAM: HOME EXERCISE PROGRAM [[id number]]  Scapular retraction -  Repeat 15 Repetitions, Hold 2 Seconds, Complete 2 Sets, Perform 2 Times a Day  ASSESSMENT:  CLINICAL IMPRESSION: Patient's pain localized to left T6-8 musculature.  He felt good after treatment and is pleased with his overall progress.   OBJECTIVE IMPAIRMENTS: decreased activity  tolerance, increased muscle spasms, postural dysfunction, and pain.   ACTIVITY LIMITATIONS: carrying, lifting, and bending  PARTICIPATION LIMITATIONS: meal prep, cleaning, laundry, occupation, and yard work  PERSONAL FACTORS: Time since onset of injury/illness/exacerbation are also affecting patient's functional outcome.   REHAB POTENTIAL: Good  CLINICAL DECISION MAKING: Evolving/moderate complexity  EVALUATION COMPLEXITY: Low   GOALS: LONG TERM GOALS: Target date: 11/19/23  Ind with a HEP.  Goal status: MET.  2.  Sit 30 minutes with pain not > 2-3/10.  Goal status: Partially met.  3.  Walk a community distance with pain not > 2-3/10. Baseline:  Goal status: Partially met. 4.  Perform ADL's with pain not > 2-3/10.  Goal status: Partially met. PLAN:  PT FREQUENCY: 2x/week  PT DURATION: 6 weeks  PLANNED INTERVENTIONS: 97110-Therapeutic exercises, 97530- Therapeutic activity, [id number]- Neuromuscular re-education, 97535- Self Care, 09811- Manual therapy, G0283- Electrical stimulation (unattended), 97035- Ultrasound, Patient/Family education, Joint mobilization, Spinal mobilization, Cryotherapy, and Moist heat.  PLAN FOR NEXT SESSION: Combo e'stim/US  at 1.50 W/CM2, STW/M, gentle thoracic PA mobs and costo-vertebral mobs.  Postural exercises.   Juanna Pudlo, [place], PT 11/17/2023, 5:11 PM

## 2023-11-19 ENCOUNTER — Encounter: Admitting: Physical Therapy

## 2023-11-26 ENCOUNTER — Ambulatory Visit: Attending: Neurosurgery | Admitting: Physical Therapy

## 2023-11-26 DIAGNOSIS — M546 Pain in thoracic spine: Secondary | ICD-10-CM | POA: Diagnosis present

## 2023-11-26 DIAGNOSIS — M62830 Muscle spasm of back: Secondary | ICD-10-CM | POA: Diagnosis present

## 2023-11-26 NOTE — Therapy (Signed)
 OUTPATIENT PHYSICAL THERAPY THORACOLUMBAR TREATMENT  Patient Name: Justin Ellis MRN: [id number] DOB:[birth year], 38 y.o., male Today's Date: 11/26/2023  END OF SESSION:  PT End of Session - 11/26/23 1607     Visit Number 10    Number of Visits 12    Date for PT Re-Evaluation 11/19/23    PT Start Time 0400    PT Stop Time 0454    PT Time Calculation (min) 54 min    Activity Tolerance Patient tolerated treatment well    Behavior During Therapy [facility] for tasks assessed/performed               Past Medical History:  Diagnosis Date   Migraine headache    2-3x/wk   Vasovagal near syncope 03/10/2012   Past Surgical History:  Procedure Laterality Date   NO PAST SURGERIES     SEPTOPLASTY N/A 04/30/2017   Procedure: SEPTOPLASTY;  Surgeon: Mellody Sprout, MD;  Location: [facility];  Service: ENT;  Laterality: N/A;  GAVE DISK TO CE[facility] 9-25-KP   TURBINATE REDUCTION Bilateral 04/30/2017   Procedure: BILATERAL INFERIOR TURBINATE REDUCTION;  Surgeon: Mellody Sprout, MD;  Location: [facility];  Service: ENT;  Laterality: Bilateral;   Patient Active Problem List   Diagnosis Date Noted   Adverse food reaction 03/03/2017   Non-seasonal allergic rhinitis due to fungal spores 03/03/2017   SOB (shortness of breath) 03/03/2017   Vasovagal near syncope 03/10/2012   REFERRING PROVIDER: Gearl Keens MD  REFERRING DIAG: Thoracic spine pain.  Rationale for Evaluation and Treatment: Rehabilitation  THERAPY DIAG:  Pain in thoracic spine  Muscle spasm of back  ONSET DATE: ~1.5 years.    SUBJECTIVE:                                                                                                                                                                                           SUBJECTIVE STATEMENT: Had a good week.  Pain low and feeling about "65% better overall.    PERTINENT HISTORY:  See above.  PAIN:  Are you having pain? Yes: NPRS scale: Low/10. Pain location:  Left mid-thoracic. Pain description: Ache, sore, throbbing.  "Persistent, never completely goes away." Aggravating factors: As above.   Relieving factors: Lying down.  Heat.  PRECAUTIONS: None  RED FLAGS: None   WEIGHT BEARING RESTRICTIONS: No  FALLS:  Has patient fallen in last 6 months? No  LIVING ENVIRONMENT: Lives with: lives with their spouse Lives in: House/apartment Has following equipment at home: None  OCCUPATION: Works as Merchandiser, retail at a Teaching laboratory technician.    PLOF: Independent  PATIENT GOALS: Get out of pain.  OBJECTIVE:  Note: Objective measures were completed at Evaluation unless otherwise noted.  DIAGNOSTIC FINDINGS:  Per MD note:  "Thoracic MRI overall looks good."    PATIENT SURVEYS:  Modified Oswestry 18/50.      POSTURE: rounded shoulders and forward head  PALPATION: Very tender to palpation just left of T6 to T8 with significant paraspinal muscle guarding.    ROM:  Full active trunk flexion.     LOWER EXTREMITY MMT:    Normal UE strength.  DTR's:   UE DTR's are 1+/4+.    GAIT:  TREATMENT DATE:   11/26/23:  UBE x 8 minutes f/b combo e'stim/US  at 1.50 W/CM2 x 8 minutes left of T6-8 f/b STW/M x 10 minutes f/b HMP and IFC at 80-150 Hz on 40% scan x 20 minutes.  Normal modality response following removal of modality.  11/17/23:  UBE x 10 minutes f/b STW/M x 13 minutes left of T6-8 musculature f/b HMP and IFC at 80-150 Hz on 40% scan x 20 minutes.    PATIENT EDUCATION:  Education details: Scapular retraction.  Person educated: Patient Education method: Demo Education comprehension: Excellent  HOME EXERCISE PROGRAM: HOME EXERCISE PROGRAM [[id number]]  Scapular retraction -  Repeat 15 Repetitions, Hold 2 Seconds, Complete 2 Sets, Perform 2 Times a Day  ASSESSMENT:  CLINICAL IMPRESSION: See below.  OBJECTIVE IMPAIRMENTS: decreased activity tolerance, increased muscle spasms, postural dysfunction, and pain.    ACTIVITY LIMITATIONS: carrying, lifting, and bending  PARTICIPATION LIMITATIONS: meal prep, cleaning, laundry, occupation, and yard work  PERSONAL FACTORS: Time since onset of injury/illness/exacerbation are also affecting patient's functional outcome.   REHAB POTENTIAL: Good  CLINICAL DECISION MAKING: Evolving/moderate complexity  EVALUATION COMPLEXITY: Low   GOALS: LONG TERM GOALS: Target date: 11/19/23  Ind with a HEP.  Goal status: MET.  2.  Sit 30 minutes with pain not > 2-3/10.  Goal status: Partially met.  3.  Walk a community distance with pain not > 2-3/10. Baseline:  Goal status: Partially met.  4.  Perform ADL's with pain not > 2-3/10.  Goal status: Partially met. PLAN:  PT FREQUENCY: 2x/week  PT DURATION: 6 weeks  PLANNED INTERVENTIONS: 97110-Therapeutic exercises, 97530- Therapeutic activity, [id number]- Neuromuscular re-education, 97535- Self Care, 81191- Manual therapy, G0283- Electrical stimulation (unattended), 97035- Ultrasound, Patient/Family education, Joint mobilization, Spinal mobilization, Cryotherapy, and Moist heat.  PLAN FOR NEXT SESSION: Combo e'stim/US  at 1.50 W/CM2, STW/M, gentle thoracic PA mobs and costo-vertebral mobs.  Postural exercises.  Progress Note Reporting Period 10/08/23 to 11/26/23.  See note below for Objective Data and Assessment of Progress/Goals. Patient is progressing well toward goals.  He reports a subjective improvement rating of 65%.  He states he had to lift a heavy box at work and used good Estate manager/land agent and was able to perform without pain increase.      Jovoni Borkenhagen, [place], PT 11/26/2023, 5:48 PM

## 2023-12-01 ENCOUNTER — Encounter: Admitting: Physical Therapy

## 2023-12-08 ENCOUNTER — Ambulatory Visit: Admitting: *Deleted

## 2023-12-08 ENCOUNTER — Encounter: Payer: Self-pay | Admitting: *Deleted

## 2023-12-08 DIAGNOSIS — M546 Pain in thoracic spine: Secondary | ICD-10-CM

## 2023-12-08 DIAGNOSIS — M62830 Muscle spasm of back: Secondary | ICD-10-CM

## 2023-12-08 NOTE — Therapy (Signed)
 OUTPATIENT PHYSICAL THERAPY THORACOLUMBAR TREATMENT  Patient Name: Justin Ellis MRN: [id number] DOB:[birth year], 38 y.o., male Today's Date: 12/08/2023  END OF SESSION:  PT End of Session - 12/08/23 1605     Visit Number 11    Number of Visits 12    Date for PT Re-Evaluation 11/19/23    PT Start Time 1558    PT Stop Time 1656    PT Time Calculation (min) 58 min               Past Medical History:  Diagnosis Date   Migraine headache    2-3x/wk   Vasovagal near syncope 03/10/2012   Past Surgical History:  Procedure Laterality Date   NO PAST SURGERIES     SEPTOPLASTY N/A 04/30/2017   Procedure: SEPTOPLASTY;  Surgeon: Mellody Sprout, MD;  Location: [facility];  Service: ENT;  Laterality: N/A;  GAVE DISK TO [facility] 9-25-KP   TURBINATE REDUCTION Bilateral 04/30/2017   Procedure: BILATERAL INFERIOR TURBINATE REDUCTION;  Surgeon: Mellody Sprout, MD;  Location: [facility];  Service: ENT;  Laterality: Bilateral;   Patient Active Problem List   Diagnosis Date Noted   Adverse food reaction 03/03/2017   Non-seasonal allergic rhinitis due to fungal spores 03/03/2017   SOB (shortness of breath) 03/03/2017   Vasovagal near syncope 03/10/2012   REFERRING PROVIDER: Gearl Keens MD  REFERRING DIAG: Thoracic spine pain.  Rationale for Evaluation and Treatment: Rehabilitation  THERAPY DIAG:  Pain in thoracic spine  Muscle spasm of back  ONSET DATE: ~1.5 years.    SUBJECTIVE:                                                                                                                                                                                           SUBJECTIVE STATEMENT: Had a good week and was able to do things around the house without increased pain after. Pain low and feeling about "65% better overall. MD visit went well and will f/u in 6 weeks.   PERTINENT HISTORY:  See above.  PAIN:  Are you having pain? Yes: NPRS scale: 2-3/10. Pain location:  Left mid-thoracic. Pain description: Ache, sore, throbbing.  "Persistent, never completely goes away." Aggravating factors: As above.   Relieving factors: Lying down.  Heat.  PRECAUTIONS: None  RED FLAGS: None   WEIGHT BEARING RESTRICTIONS: No  FALLS:  Has patient fallen in last 6 months? No  LIVING ENVIRONMENT: Lives with: lives with their spouse Lives in: House/apartment Has following equipment at home: None  OCCUPATION: Works as Merchandiser, retail at a Teaching laboratory technician.    PLOF: Independent  PATIENT GOALS: Get out  of pain.     OBJECTIVE:  Note: Objective measures were completed at Evaluation unless otherwise noted.  DIAGNOSTIC FINDINGS:  Per MD note:  "Thoracic MRI overall looks good."    PATIENT SURVEYS:  Modified Oswestry 18/50.      POSTURE: rounded shoulders and forward head  PALPATION: Very tender to palpation just left of T6 to T8 with significant paraspinal muscle guarding.    ROM:  Full active trunk flexion.     LOWER EXTREMITY MMT:    Normal UE strength.  DTR's:   UE DTR's are 1+/4+.    GAIT:  TREATMENT DATE:   12/08/23:   UBE x 10 minutes 90 RPMS   Combo e'stim/US  at 1.50 W/CM2 x 10 minutes left of T6-8   STW/M x 8 minutes   HMP and IFC at 80-150 Hz on 40% scan x 18  minutes.  Normal modality response following removal of modality.  11/17/23:  UBE x 10 minutes f/b STW/M x 13 minutes left of T6-8 musculature f/b HMP and IFC at 80-150 Hz on 40% scan x 20 minutes.    PATIENT EDUCATION:  Education details: Scapular retraction.  Person educated: Patient Education method: Demo Education comprehension: Excellent  HOME EXERCISE PROGRAM: HOME EXERCISE PROGRAM [[id number]]  Scapular retraction -  Repeat 15 Repetitions, Hold 2 Seconds, Complete 2 Sets, Perform 2 Times a Day  ASSESSMENT:  CLINICAL IMPRESSION: Pt arrived today doing better overall and reports MD visit went well and will f/u in 6 weeks. Rx focused on therex as well as  US  combo and STW to LT side thoracic. Pt did great with Rx again with pain 2-3/10 still. Check LTGs and DC next visit  OBJECTIVE IMPAIRMENTS: decreased activity tolerance, increased muscle spasms, postural dysfunction, and pain.   ACTIVITY LIMITATIONS: carrying, lifting, and bending  PARTICIPATION LIMITATIONS: meal prep, cleaning, laundry, occupation, and yard work  PERSONAL FACTORS: Time since onset of injury/illness/exacerbation are also affecting patient's functional outcome.   REHAB POTENTIAL: Good  CLINICAL DECISION MAKING: Evolving/moderate complexity  EVALUATION COMPLEXITY: Low   GOALS: LONG TERM GOALS: Target date: 11/19/23  Ind with a HEP.  Goal status: MET.  2.  Sit 30 minutes with pain not > 2-3/10.  Goal status: Partially met.  3.  Walk a community distance with pain not > 2-3/10. Baseline:  Goal status: Partially met.  4.  Perform ADL's with pain not > 2-3/10.  Goal status: Partially met. PLAN:  PT FREQUENCY: 2x/week  PT DURATION: 6 weeks  PLANNED INTERVENTIONS: 97110-Therapeutic exercises, 97530- Therapeutic activity, [id number]- Neuromuscular re-education, 97535- Self Care, 16109- Manual therapy, [id number]- Electrical stimulation (unattended), 97035- Ultrasound, Patient/Family education, Joint mobilization, Spinal mobilization, Cryotherapy, and Moist heat.  PLAN FOR NEXT SESSION:  Review HEP , LTGs and DC after next visit  Heatherly Stenner,CHRIS, PTA 12/08/2023, 4:57 PM

## 2023-12-15 ENCOUNTER — Ambulatory Visit: Admitting: Physical Therapy

## 2023-12-15 DIAGNOSIS — M546 Pain in thoracic spine: Secondary | ICD-10-CM | POA: Diagnosis not present

## 2023-12-15 DIAGNOSIS — M62830 Muscle spasm of back: Secondary | ICD-10-CM

## 2023-12-15 NOTE — Therapy (Addendum)
 OUTPATIENT PHYSICAL THERAPY THORACOLUMBAR TREATMENT  Patient Name: Justin Ellis MRN: [id number] DOB:1985-12-19, 38 y.o., male Today's Date: 12/15/2023  END OF SESSION:  PT End of Session - 12/15/23 1608     Visit Number 12    Number of Visits 12    Date for PT Re-Evaluation 11/19/23    PT Start Time 0402    PT Stop Time 0459    PT Time Calculation (min) 57 min    Activity Tolerance Patient tolerated treatment well    Behavior During Therapy [facility] for tasks assessed/performed               Past Medical History:  Diagnosis Date   Migraine headache    2-3x/wk   Vasovagal near syncope 03/10/2012   Past Surgical History:  Procedure Laterality Date   NO PAST SURGERIES     SEPTOPLASTY N/A 04/30/2017   Procedure: SEPTOPLASTY;  Surgeon: Mellody Sprout, MD;  Location: [facility];  Service: ENT;  Laterality: N/A;  GAVE DISK TO [facility] 9-25-KP   TURBINATE REDUCTION Bilateral 04/30/2017   Procedure: BILATERAL INFERIOR TURBINATE REDUCTION;  Surgeon: Mellody Sprout, MD;  Location: [facility];  Service: ENT;  Laterality: Bilateral;   Patient Active Problem List   Diagnosis Date Noted   Adverse food reaction 03/03/2017   Non-seasonal allergic rhinitis due to fungal spores 03/03/2017   SOB (shortness of breath) 03/03/2017   Vasovagal near syncope 03/10/2012   REFERRING PROVIDER: Gearl Keens MD  REFERRING DIAG: Thoracic spine pain.  Rationale for Evaluation and Treatment: Rehabilitation  THERAPY DIAG:  Pain in thoracic spine  Muscle spasm of back  ONSET DATE: ~1.5 years.    SUBJECTIVE:                                                                                                                                                                                           SUBJECTIVE STATEMENT: "65 to 70% better."  PERTINENT HISTORY:  See above.  PAIN:  Are you having pain? Yes: NPRS scale: 2-3/10. Pain location: Left mid-thoracic. Pain description: Ache,  sore, throbbing.  "Persistent, never completely goes away." Aggravating factors: As above.   Relieving factors: Lying down.  Heat.  PRECAUTIONS: None  RED FLAGS: None   WEIGHT BEARING RESTRICTIONS: No  FALLS:  Has patient fallen in last 6 months? No  LIVING ENVIRONMENT: Lives with: lives with their spouse Lives in: House/apartment Has following equipment at home: None  OCCUPATION: Works as Merchandiser, retail at a Teaching laboratory technician.    PLOF: Independent  PATIENT GOALS: Get out of pain.     OBJECTIVE:  Note: Objective measures were completed  at Evaluation unless otherwise noted.  DIAGNOSTIC FINDINGS:  Per MD note:  "Thoracic MRI overall looks good."    PATIENT SURVEYS:  Modified Oswestry 18/50.     **12/15/23:  6/50  POSTURE: rounded shoulders and forward head  PALPATION: Very tender to palpation just left of T6 to T8 with significant paraspinal muscle guarding.    ROM:  Full active trunk flexion.     LOWER EXTREMITY MMT:    Normal UE strength.  DTR's:   UE DTR's are 1+/4+.    GAIT:  TREATMENT DATE:   12/15/23:  UBE x 10 minutes at 90 RPM's f/b STW/M x 13 minutes to affected left thoracic region f/b HMP and IFC at 80-150 Hz on 40% scan x 20 minutes.  Normal modality response following removal of modality.   12/08/23:   UBE x 10 minutes 90 RPMS   Combo e'stim/US  at 1.50 W/CM2 x 10 minutes left of T6-8   STW/M x 8 minutes   HMP and IFC at 80-150 Hz on 40% scan x 18  minutes.  Normal modality response following removal of modality.  11/17/23:  UBE x 10 minutes f/b STW/M x 13 minutes left of T6-8 musculature f/b HMP and IFC at 80-150 Hz on 40% scan x 20 minutes.    PATIENT EDUCATION:  Education details: Scapular retraction.  Person educated: Patient Education method: Demo Education comprehension: Excellent  HOME EXERCISE PROGRAM: HOME EXERCISE PROGRAM [[id number]]  Scapular retraction -  Repeat 15 Repetitions, Hold 2 Seconds, Complete 2 Sets,  Perform 2 Times a Day  ASSESSMENT:  CLINICAL IMPRESSION: See below. OBJECTIVE IMPAIRMENTS: decreased activity tolerance, increased muscle spasms, postural dysfunction, and pain.   ACTIVITY LIMITATIONS: carrying, lifting, and bending  PARTICIPATION LIMITATIONS: meal prep, cleaning, laundry, occupation, and yard work  PERSONAL FACTORS: Time since onset of injury/illness/exacerbation are also affecting patient's functional outcome.   REHAB POTENTIAL: Good  CLINICAL DECISION MAKING: Evolving/moderate complexity  EVALUATION COMPLEXITY: Low   GOALS: LONG TERM GOALS: Target date: 11/19/23  Ind with a HEP.  Goal status: MET.  2.  Sit 30 minutes with pain not > 2-3/10.  Goal status: Partially met.  3.  Walk a community distance with pain not > 2-3/10. Goal status: MET.Aaron Aas  4.  Perform ADL's with pain not > 2-3/10.  Goal status: MET PLAN:  PT FREQUENCY: 2x/week  PT DURATION: 6 weeks  PLANNED INTERVENTIONS: 97110-Therapeutic exercises, 97530- Therapeutic activity, [id number]- Neuromuscular re-education, 97535- Self Care, 16109- Manual therapy, G0283- Electrical stimulation (unattended), 97035- Ultrasound, Patient/Family education, Joint mobilization, Spinal mobilization, Cryotherapy, and Moist heat.  PLAN FOR NEXT SESSION:  Review HEP , LTGs and DC after next visit    PHYSICAL THERAPY DISCHARGE SUMMARY  Visits from Start of Care: 12.  Current functional level related to goals / functional outcomes: Modified Owestry improved from 18 to 6/50.   Remaining deficits: LTG's #1, 3 and 4 met with a subjective improvement rating of 65 to 70%.   Education / Equipment: HEP and uses a foam-type roller and TENS unit at home.     Patient agrees to discharge. Patient goals were partially met. Patient is being discharged due to completing course of PT.    Justin Ellis, [place], PT 12/15/2023, 5:49 PM
